# Patient Record
Sex: Female | Born: 1935 | Race: White | Hispanic: No | Smoking: Never smoker
Health system: Southern US, Community
[De-identification: ages and names within clinical notes are randomized; demographics above are authoritative.]

## PROBLEM LIST (undated history)

## (undated) DIAGNOSIS — T8859XA Other complications of anesthesia, initial encounter: Secondary | ICD-10-CM

## (undated) DIAGNOSIS — B029 Zoster without complications: Secondary | ICD-10-CM

## (undated) DIAGNOSIS — Z9221 Personal history of antineoplastic chemotherapy: Secondary | ICD-10-CM

## (undated) DIAGNOSIS — K5900 Constipation, unspecified: Secondary | ICD-10-CM

## (undated) DIAGNOSIS — C801 Malignant (primary) neoplasm, unspecified: Secondary | ICD-10-CM

## (undated) DIAGNOSIS — T884XXA Failed or difficult intubation, initial encounter: Secondary | ICD-10-CM

## (undated) DIAGNOSIS — Z9289 Personal history of other medical treatment: Secondary | ICD-10-CM

## (undated) DIAGNOSIS — Z8781 Personal history of (healed) traumatic fracture: Secondary | ICD-10-CM

## (undated) DIAGNOSIS — J189 Pneumonia, unspecified organism: Secondary | ICD-10-CM

## (undated) DIAGNOSIS — Z8619 Personal history of other infectious and parasitic diseases: Secondary | ICD-10-CM

## (undated) DIAGNOSIS — M1990 Unspecified osteoarthritis, unspecified site: Secondary | ICD-10-CM

## (undated) DIAGNOSIS — C50919 Malignant neoplasm of unspecified site of unspecified female breast: Secondary | ICD-10-CM

## (undated) DIAGNOSIS — A159 Respiratory tuberculosis unspecified: Secondary | ICD-10-CM

## (undated) DIAGNOSIS — T4145XA Adverse effect of unspecified anesthetic, initial encounter: Secondary | ICD-10-CM

## (undated) DIAGNOSIS — D649 Anemia, unspecified: Secondary | ICD-10-CM

## (undated) DIAGNOSIS — I639 Cerebral infarction, unspecified: Secondary | ICD-10-CM

## (undated) DIAGNOSIS — Z87440 Personal history of urinary (tract) infections: Secondary | ICD-10-CM

## (undated) HISTORY — PX: APPENDECTOMY: SHX54

## (undated) HISTORY — PX: BREAST SURGERY: SHX581

## (undated) HISTORY — PX: FRACTURE SURGERY: SHX138

## (undated) HISTORY — PX: HEMORRHOID SURGERY: SHX153

## (undated) HISTORY — PX: HEMORROIDECTOMY: SUR656

## (undated) HISTORY — PX: EYE SURGERY: SHX253

## (undated) HISTORY — PX: TONSILLECTOMY: SUR1361

## (undated) HISTORY — DX: Malignant neoplasm of unspecified site of unspecified female breast: C50.919

## (undated) HISTORY — PX: SHOULDER SURGERY: SHX246

## (undated) HISTORY — PX: ABDOMINAL HYSTERECTOMY: SHX81

## (undated) HISTORY — DX: Malignant (primary) neoplasm, unspecified: C80.1

## (undated) HISTORY — PX: CATARACT EXTRACTION: SUR2

## (undated) HISTORY — PX: OOPHORECTOMY: SHX86

## (undated) HISTORY — DX: Personal history of other infectious and parasitic diseases: Z86.19

## (undated) HISTORY — DX: Unspecified osteoarthritis, unspecified site: M19.90

## (undated) HISTORY — PX: OTHER SURGICAL HISTORY: SHX169

## (undated) HISTORY — PX: COLONOSCOPY: SHX174

---

## 1998-02-27 ENCOUNTER — Other Ambulatory Visit: Admission: RE | Admit: 1998-02-27 | Discharge: 1998-02-27 | Payer: Self-pay | Admitting: Obstetrics and Gynecology

## 1998-09-25 ENCOUNTER — Other Ambulatory Visit: Admission: RE | Admit: 1998-09-25 | Discharge: 1998-09-25 | Payer: Self-pay | Admitting: Obstetrics and Gynecology

## 1998-12-02 ENCOUNTER — Encounter (HOSPITAL_COMMUNITY): Payer: Self-pay | Admitting: Oncology

## 1998-12-02 ENCOUNTER — Ambulatory Visit (HOSPITAL_COMMUNITY): Admission: RE | Admit: 1998-12-02 | Discharge: 1998-12-02 | Payer: Self-pay | Admitting: Oncology

## 1999-04-14 ENCOUNTER — Other Ambulatory Visit: Admission: RE | Admit: 1999-04-14 | Discharge: 1999-04-14 | Payer: Self-pay | Admitting: Obstetrics and Gynecology

## 1999-10-26 ENCOUNTER — Other Ambulatory Visit: Admission: RE | Admit: 1999-10-26 | Discharge: 1999-10-26 | Payer: Self-pay | Admitting: Obstetrics and Gynecology

## 1999-12-09 ENCOUNTER — Ambulatory Visit (HOSPITAL_COMMUNITY): Admission: RE | Admit: 1999-12-09 | Discharge: 1999-12-09 | Payer: Self-pay | Admitting: Oncology

## 1999-12-09 ENCOUNTER — Encounter (HOSPITAL_COMMUNITY): Payer: Self-pay | Admitting: Oncology

## 1999-12-16 ENCOUNTER — Ambulatory Visit (HOSPITAL_COMMUNITY): Admission: RE | Admit: 1999-12-16 | Discharge: 1999-12-16 | Payer: Self-pay | Admitting: Oncology

## 1999-12-16 ENCOUNTER — Encounter (INDEPENDENT_AMBULATORY_CARE_PROVIDER_SITE_OTHER): Payer: Self-pay

## 1999-12-16 ENCOUNTER — Encounter (HOSPITAL_COMMUNITY): Payer: Self-pay | Admitting: Oncology

## 2000-09-01 ENCOUNTER — Encounter: Admission: RE | Admit: 2000-09-01 | Discharge: 2000-09-01 | Payer: Self-pay | Admitting: Oncology

## 2000-09-01 ENCOUNTER — Encounter (HOSPITAL_COMMUNITY): Payer: Self-pay | Admitting: Oncology

## 2000-10-17 ENCOUNTER — Other Ambulatory Visit: Admission: RE | Admit: 2000-10-17 | Discharge: 2000-10-17 | Payer: Self-pay | Admitting: Obstetrics and Gynecology

## 2000-12-13 ENCOUNTER — Encounter (HOSPITAL_COMMUNITY): Payer: Self-pay | Admitting: Oncology

## 2000-12-13 ENCOUNTER — Encounter: Admission: RE | Admit: 2000-12-13 | Discharge: 2000-12-13 | Payer: Self-pay | Admitting: Oncology

## 2001-01-23 ENCOUNTER — Ambulatory Visit (HOSPITAL_COMMUNITY): Admission: RE | Admit: 2001-01-23 | Discharge: 2001-01-23 | Payer: Self-pay | Admitting: Internal Medicine

## 2001-01-23 ENCOUNTER — Encounter: Payer: Self-pay | Admitting: Internal Medicine

## 2001-02-28 ENCOUNTER — Encounter: Admission: RE | Admit: 2001-02-28 | Discharge: 2001-02-28 | Payer: Self-pay | Admitting: Oncology

## 2001-02-28 ENCOUNTER — Encounter (HOSPITAL_COMMUNITY): Payer: Self-pay | Admitting: Oncology

## 2001-04-24 ENCOUNTER — Other Ambulatory Visit: Admission: RE | Admit: 2001-04-24 | Discharge: 2001-04-24 | Payer: Self-pay | Admitting: Obstetrics and Gynecology

## 2001-11-28 ENCOUNTER — Encounter: Admission: RE | Admit: 2001-11-28 | Discharge: 2001-11-28 | Payer: Self-pay | Admitting: Oncology

## 2001-11-28 ENCOUNTER — Encounter (HOSPITAL_COMMUNITY): Admission: RE | Admit: 2001-11-28 | Discharge: 2001-12-28 | Payer: Self-pay | Admitting: Oncology

## 2001-12-10 ENCOUNTER — Ambulatory Visit (HOSPITAL_COMMUNITY): Admission: RE | Admit: 2001-12-10 | Discharge: 2001-12-10 | Payer: Self-pay | Admitting: Gastroenterology

## 2002-02-20 ENCOUNTER — Encounter (HOSPITAL_COMMUNITY): Admission: RE | Admit: 2002-02-20 | Discharge: 2002-03-22 | Payer: Self-pay | Admitting: Oncology

## 2002-02-20 ENCOUNTER — Encounter: Admission: RE | Admit: 2002-02-20 | Discharge: 2002-02-20 | Payer: Self-pay | Admitting: Oncology

## 2002-03-13 ENCOUNTER — Other Ambulatory Visit: Admission: RE | Admit: 2002-03-13 | Discharge: 2002-03-13 | Payer: Self-pay | Admitting: Obstetrics and Gynecology

## 2002-04-25 ENCOUNTER — Inpatient Hospital Stay (HOSPITAL_COMMUNITY): Admission: RE | Admit: 2002-04-25 | Discharge: 2002-04-27 | Payer: Self-pay | Admitting: Orthopaedic Surgery

## 2002-04-25 ENCOUNTER — Encounter: Payer: Self-pay | Admitting: Orthopaedic Surgery

## 2002-05-03 ENCOUNTER — Emergency Department (HOSPITAL_COMMUNITY): Admission: EM | Admit: 2002-05-03 | Discharge: 2002-05-03 | Payer: Self-pay | Admitting: Emergency Medicine

## 2002-11-26 ENCOUNTER — Encounter: Admission: RE | Admit: 2002-11-26 | Discharge: 2002-11-26 | Payer: Self-pay | Admitting: Oncology

## 2002-11-26 ENCOUNTER — Encounter (HOSPITAL_COMMUNITY): Admission: RE | Admit: 2002-11-26 | Discharge: 2002-12-26 | Payer: Self-pay | Admitting: Oncology

## 2002-12-22 ENCOUNTER — Encounter: Payer: Self-pay | Admitting: Emergency Medicine

## 2002-12-22 ENCOUNTER — Emergency Department (HOSPITAL_COMMUNITY): Admission: EM | Admit: 2002-12-22 | Discharge: 2002-12-22 | Payer: Self-pay | Admitting: Emergency Medicine

## 2002-12-27 ENCOUNTER — Encounter (HOSPITAL_COMMUNITY): Payer: Self-pay | Admitting: Oncology

## 2002-12-27 ENCOUNTER — Encounter: Admission: RE | Admit: 2002-12-27 | Discharge: 2002-12-27 | Payer: Self-pay | Admitting: Oncology

## 2003-03-19 ENCOUNTER — Other Ambulatory Visit: Admission: RE | Admit: 2003-03-19 | Discharge: 2003-03-19 | Payer: Self-pay | Admitting: Obstetrics and Gynecology

## 2003-04-17 ENCOUNTER — Encounter (HOSPITAL_COMMUNITY): Payer: Self-pay | Admitting: Oncology

## 2003-04-17 ENCOUNTER — Encounter: Admission: RE | Admit: 2003-04-17 | Discharge: 2003-04-17 | Payer: Self-pay | Admitting: Oncology

## 2003-12-08 ENCOUNTER — Encounter: Admission: RE | Admit: 2003-12-08 | Discharge: 2003-12-08 | Payer: Self-pay | Admitting: Oncology

## 2003-12-08 ENCOUNTER — Encounter (HOSPITAL_COMMUNITY): Admission: RE | Admit: 2003-12-08 | Discharge: 2004-01-07 | Payer: Self-pay | Admitting: Oncology

## 2004-03-22 ENCOUNTER — Other Ambulatory Visit: Admission: RE | Admit: 2004-03-22 | Discharge: 2004-03-22 | Payer: Self-pay | Admitting: Obstetrics and Gynecology

## 2004-07-28 ENCOUNTER — Encounter: Admission: RE | Admit: 2004-07-28 | Discharge: 2004-07-28 | Payer: Self-pay | Admitting: Obstetrics and Gynecology

## 2004-12-07 ENCOUNTER — Ambulatory Visit (HOSPITAL_COMMUNITY): Payer: Self-pay | Admitting: Oncology

## 2004-12-07 ENCOUNTER — Encounter (HOSPITAL_COMMUNITY): Admission: RE | Admit: 2004-12-07 | Discharge: 2005-01-06 | Payer: Self-pay | Admitting: Oncology

## 2004-12-07 ENCOUNTER — Encounter: Admission: RE | Admit: 2004-12-07 | Discharge: 2004-12-07 | Payer: Self-pay | Admitting: Oncology

## 2005-03-24 ENCOUNTER — Other Ambulatory Visit: Admission: RE | Admit: 2005-03-24 | Discharge: 2005-03-24 | Payer: Self-pay | Admitting: Obstetrics and Gynecology

## 2005-09-13 ENCOUNTER — Encounter: Admission: RE | Admit: 2005-09-13 | Discharge: 2005-09-13 | Payer: Self-pay | Admitting: Obstetrics and Gynecology

## 2005-12-09 ENCOUNTER — Encounter (HOSPITAL_COMMUNITY): Admission: RE | Admit: 2005-12-09 | Discharge: 2006-01-08 | Payer: Self-pay | Admitting: Oncology

## 2005-12-09 ENCOUNTER — Ambulatory Visit (HOSPITAL_COMMUNITY): Payer: Self-pay | Admitting: Oncology

## 2005-12-09 ENCOUNTER — Encounter: Admission: RE | Admit: 2005-12-09 | Discharge: 2005-12-09 | Payer: Self-pay | Admitting: Oncology

## 2006-04-20 ENCOUNTER — Other Ambulatory Visit: Admission: RE | Admit: 2006-04-20 | Discharge: 2006-04-20 | Payer: Self-pay | Admitting: Obstetrics and Gynecology

## 2006-12-19 ENCOUNTER — Ambulatory Visit (HOSPITAL_COMMUNITY): Payer: Self-pay | Admitting: Oncology

## 2006-12-19 ENCOUNTER — Encounter (HOSPITAL_COMMUNITY): Admission: RE | Admit: 2006-12-19 | Discharge: 2007-01-18 | Payer: Self-pay | Admitting: Oncology

## 2007-05-02 ENCOUNTER — Other Ambulatory Visit: Admission: RE | Admit: 2007-05-02 | Discharge: 2007-05-02 | Payer: Self-pay | Admitting: Obstetrics and Gynecology

## 2008-01-11 ENCOUNTER — Encounter (HOSPITAL_COMMUNITY): Admission: RE | Admit: 2008-01-11 | Discharge: 2008-02-10 | Payer: Self-pay | Admitting: Oncology

## 2008-01-11 ENCOUNTER — Ambulatory Visit (HOSPITAL_COMMUNITY): Payer: Self-pay | Admitting: Oncology

## 2009-01-02 ENCOUNTER — Encounter (HOSPITAL_COMMUNITY): Admission: RE | Admit: 2009-01-02 | Discharge: 2009-02-01 | Payer: Self-pay | Admitting: Oncology

## 2009-01-02 ENCOUNTER — Ambulatory Visit (HOSPITAL_COMMUNITY): Payer: Self-pay | Admitting: Oncology

## 2010-02-19 ENCOUNTER — Encounter (HOSPITAL_COMMUNITY): Admission: RE | Admit: 2010-02-19 | Discharge: 2010-03-21 | Payer: Self-pay | Admitting: Oncology

## 2010-02-19 ENCOUNTER — Ambulatory Visit (HOSPITAL_COMMUNITY): Payer: Self-pay | Admitting: Oncology

## 2010-06-22 ENCOUNTER — Emergency Department (HOSPITAL_COMMUNITY): Admission: EM | Admit: 2010-06-22 | Discharge: 2010-06-22 | Payer: Self-pay | Admitting: Family Medicine

## 2010-06-26 ENCOUNTER — Emergency Department (HOSPITAL_COMMUNITY): Admission: EM | Admit: 2010-06-26 | Discharge: 2010-06-26 | Payer: Self-pay | Admitting: Emergency Medicine

## 2010-07-31 ENCOUNTER — Emergency Department (HOSPITAL_COMMUNITY): Admission: EM | Admit: 2010-07-31 | Discharge: 2010-07-31 | Payer: Self-pay | Admitting: Emergency Medicine

## 2010-12-11 ENCOUNTER — Encounter (HOSPITAL_COMMUNITY): Payer: Self-pay | Admitting: Oncology

## 2010-12-11 ENCOUNTER — Other Ambulatory Visit: Payer: Self-pay | Admitting: Internal Medicine

## 2010-12-11 DIAGNOSIS — Z0000 Encounter for general adult medical examination without abnormal findings: Secondary | ICD-10-CM

## 2010-12-22 ENCOUNTER — Ambulatory Visit: Payer: Self-pay

## 2011-01-06 ENCOUNTER — Other Ambulatory Visit (HOSPITAL_COMMUNITY)
Admission: RE | Admit: 2011-01-06 | Discharge: 2011-01-06 | Disposition: A | Discharge status: Home / Self Care | Payer: Medicare Other | Source: Ambulatory Visit | Attending: Obstetrics and Gynecology | Admitting: Obstetrics and Gynecology

## 2011-01-06 ENCOUNTER — Other Ambulatory Visit: Payer: Self-pay | Admitting: Obstetrics and Gynecology

## 2011-01-06 ENCOUNTER — Ambulatory Visit (INDEPENDENT_AMBULATORY_CARE_PROVIDER_SITE_OTHER): Payer: Medicare Other | Admitting: Obstetrics and Gynecology

## 2011-01-06 DIAGNOSIS — Z124 Encounter for screening for malignant neoplasm of cervix: Secondary | ICD-10-CM

## 2011-01-06 DIAGNOSIS — N3941 Urge incontinence: Secondary | ICD-10-CM

## 2011-01-06 DIAGNOSIS — C549 Malignant neoplasm of corpus uteri, unspecified: Secondary | ICD-10-CM

## 2011-02-03 LAB — COMPREHENSIVE METABOLIC PANEL
AST: 24 U/L (ref 0–37)
BUN: 13 mg/dL (ref 6–23)
CO2: 26 mEq/L (ref 19–32)
Calcium: 8.9 mg/dL (ref 8.4–10.5)
Chloride: 107 mEq/L (ref 96–112)
Creatinine, Ser: 0.58 mg/dL (ref 0.4–1.2)
GFR calc Af Amer: >60 mL/min (ref >60)
GFR calc non Af Amer: >60 mL/min (ref >60)
Total Bilirubin: 0.5 mg/dL (ref 0.3–1.2)

## 2011-02-03 LAB — CBC
Hemoglobin: 13.4 g/dL (ref 12.0–15.0)
MCH: 31.2 pg (ref 26.0–34.0)
MCHC: 33.8 g/dL (ref 30.0–36.0)
MCV: 92.3 fL (ref 78.0–100.0)
RBC: 4.3 MIL/uL (ref 3.87–5.11)

## 2011-02-03 LAB — URINE CULTURE
Culture  Setup Time: 201109101718
Culture: NO GROWTH

## 2011-02-03 LAB — DIFFERENTIAL
Basophils Absolute: 0.1 10*3/uL (ref 0.0–0.1)
Eosinophils Relative: 4 % (ref 0–5)
Lymphocytes Relative: 28 % (ref 12–46)
Lymphs Abs: 1.3 10*3/uL (ref 0.7–4.0)
Neutrophils Relative %: 59 % (ref 43–77)

## 2011-02-03 LAB — URINALYSIS, ROUTINE W REFLEX MICROSCOPIC
Bilirubin Urine: NEGATIVE
Hgb urine dipstick: NEGATIVE
Nitrite: NEGATIVE
Specific Gravity, Urine: 1.006 (ref 1.005–1.030)
pH: 7 (ref 5.0–8.0)

## 2011-02-04 LAB — CBC
HCT: 39.3 % (ref 36.0–46.0)
Hemoglobin: 13.5 g/dL (ref 12.0–15.0)
MCH: 31.6 pg (ref 26.0–34.0)
MCHC: 34.2 g/dL (ref 30.0–36.0)
MCV: 92.4 fL (ref 78.0–100.0)
RBC: 4.26 MIL/uL (ref 3.87–5.11)

## 2011-02-04 LAB — URINALYSIS, ROUTINE W REFLEX MICROSCOPIC
Bilirubin Urine: NEGATIVE
Glucose, UA: NEGATIVE mg/dL
Hgb urine dipstick: NEGATIVE
Protein, ur: NEGATIVE mg/dL
Specific Gravity, Urine: 1.006 (ref 1.005–1.030)
Urobilinogen, UA: 0.2 mg/dL (ref 0.0–1.0)

## 2011-02-04 LAB — DIFFERENTIAL
Basophils Relative: 2 % — ABNORMAL HIGH (ref 0–1)
Eosinophils Absolute: 0.2 10*3/uL (ref 0.0–0.7)
Eosinophils Relative: 4 % (ref 0–5)
Lymphs Abs: 1.5 10*3/uL (ref 0.7–4.0)
Monocytes Absolute: 0.4 10*3/uL (ref 0.1–1.0)
Monocytes Relative: 10 % (ref 3–12)

## 2011-02-04 LAB — BASIC METABOLIC PANEL
CO2: 24 mEq/L (ref 19–32)
Chloride: 105 mEq/L (ref 96–112)
GFR calc Af Amer: >60 mL/min (ref >60)
Glucose, Bld: 93 mg/dL (ref 70–99)
Sodium: 137 mEq/L (ref 135–145)

## 2011-02-04 LAB — URINE CULTURE
Colony Count: NO GROWTH
Culture: NO GROWTH

## 2011-02-04 LAB — POCT URINALYSIS DIPSTICK
Bilirubin Urine: NEGATIVE
Nitrite: NEGATIVE
Protein, ur: NEGATIVE mg/dL
Urobilinogen, UA: 0.2 mg/dL (ref 0.0–1.0)
pH: 5 (ref 5.0–8.0)

## 2011-02-09 LAB — COMPREHENSIVE METABOLIC PANEL
ALT: 20 U/L (ref 0–35)
Albumin: 4.1 g/dL (ref 3.5–5.2)
Calcium: 9.2 mg/dL (ref 8.4–10.5)
Glucose, Bld: 105 mg/dL — ABNORMAL HIGH (ref 70–99)
Sodium: 140 mEq/L (ref 135–145)
Total Protein: 7.1 g/dL (ref 6.0–8.3)

## 2011-02-09 LAB — CBC
Hemoglobin: 14.1 g/dL (ref 12.0–15.0)
MCHC: 35 g/dL (ref 30.0–36.0)
Platelets: 195 10*3/uL (ref 150–400)
RDW: 13.8 % (ref 11.5–15.5)

## 2011-02-09 LAB — DIFFERENTIAL
Eosinophils Absolute: 0.2 10*3/uL (ref 0.0–0.7)
Lymphs Abs: 1.9 10*3/uL (ref 0.7–4.0)
Monocytes Absolute: 0.4 10*3/uL (ref 0.1–1.0)
Monocytes Relative: 8 % (ref 3–12)
Neutro Abs: 2.5 10*3/uL (ref 1.7–7.7)
Neutrophils Relative %: 51 % (ref 43–77)

## 2011-02-16 ENCOUNTER — Ambulatory Visit (HOSPITAL_COMMUNITY): Payer: Medicare Other | Admitting: Oncology

## 2011-03-08 LAB — CBC
Hemoglobin: 14.1 g/dL (ref 12.0–15.0)
MCHC: 34.3 g/dL (ref 30.0–36.0)
MCV: 92.1 fL (ref 78.0–100.0)
RBC: 4.45 MIL/uL (ref 3.87–5.11)

## 2011-03-08 LAB — COMPREHENSIVE METABOLIC PANEL
CO2: 26 mEq/L (ref 19–32)
Calcium: 9.2 mg/dL (ref 8.4–10.5)
Creatinine, Ser: 0.6 mg/dL (ref 0.4–1.2)
GFR calc non Af Amer: >60 mL/min (ref >60)
Glucose, Bld: 100 mg/dL — ABNORMAL HIGH (ref 70–99)

## 2011-03-08 LAB — DIFFERENTIAL
Eosinophils Absolute: 0.2 10*3/uL (ref 0.0–0.7)
Lymphocytes Relative: 31 % (ref 12–46)
Lymphs Abs: 1.8 10*3/uL (ref 0.7–4.0)
Neutro Abs: 3.5 10*3/uL (ref 1.7–7.7)
Neutrophils Relative %: 59 % (ref 43–77)

## 2011-03-09 ENCOUNTER — Encounter (HOSPITAL_COMMUNITY): Payer: Medicare Other | Attending: Oncology | Admitting: Oncology

## 2011-03-09 DIAGNOSIS — C50919 Malignant neoplasm of unspecified site of unspecified female breast: Secondary | ICD-10-CM

## 2011-04-08 NOTE — Op Note (Signed)
[facility]  Patient:    [name], [name] Visit Number: [id number] MRN: [id number]          Service Type: DSU Location: [phone] Attending Physician:  Marcene Corning Dictated by:   Lubertha Basque. Jerl Santos, M.D. Proc. Date: 04/26/02 Admit Date:  04/25/2002 Discharge Date: 04/27/2002                             Operative Report  PREOPERATIVE DIAGNOSIS:  Left shoulder four-part proximal humerus fracture.  POSTOPERATIVE DIAGNOSIS: 1. Left shoulder four-part proximal humerus fracture. 2. Left shoulder glenoid fracture. 3. Left shoulder coracoid fracture.  OPERATION PERFORMED: 1. Left shoulder hemiarthroplasty. 2. Left shoulder glenoid open reduction internal fixation. 3. Left shoulder coracoid transfer.  ANESTHESIA:  General and block.  ATTENDING SURGEON:  Lubertha Basque. Jerl Santos, M.D.  ASSISTANT:  Lindwood Qua, P.A.  INDICATIONS FOR PROCEDURE:  The patient is a 77 year old woman who fell two days ago at [facility].  She sustained a four-part proximal humerus fracture with the humeral head being dislocated.  It was recommended that she have a hemiarthroplasty performed.  Informed operative consent was obtained after discussion of possible complications of reaction to anesthesia, infection, pulmonary embolus and death.  She also understood about the probability of significant shoulder stiffness after this type of injury.  DESCRIPTION OF PROCEDURE:  The patient was taken to an operating suite where general anesthetic was applied without difficulty.  She was also given an interscalene block in the preanesthesia area.  She was then positioned in the beach chair position and prepped and draped in normal sterile fashion.  After the administration of preop intravenous antibiotics, extended deltopectoral approach was taken to the left shoulder.  The deltopectoral interval was found.  The cephalic vein was taken with the deltoid laterally.  At this  point a coracoid fracture was encountered with an avulsion of the pectoralis minor and conjoined tendon.  This was tagged for later treatment.  On further dissection, the lesser tuberosity fragment with the attached subscapularis was isolated and tagged.  The humeral head was removed and a portion of rotator cuff was salvaged with an attached piece of bone and this too was tagged.  It was found that the glenoid had a major fracture with a completely displaced inferior third.  This had all the labral tissues attached.  This was reduced to the upper two thirds and secured with two screws from the Depuy Ace small fragment set.  Used two of the cancellous cannulated screws and we achieved good fixation.  Once this was completed, the humeral shaft was isolated.  It was felt that the size 10 prosthesis would work best and this was reamed up to size 10.  A cement plug was placed distally followed by mixing of cement with antibiotic.  This was then pressurized into the humeral shaft followed by placement of a #10 global FX stem in about 30 degrees of retroversion.  This was left about 1 cm proud as during the trial reduction, this was found to be the best position.  The prosthesis well held until cement had hardened and excess was trimmed.  The 44 x 15 head was found to be best during the trial reduction and this was eventually placed with the [facility] taper.  The construct was then reduced into the glenoid.  The greater tuberosity and lesser tuberosity bone fragments were sewn to the fins of the prosthesis with nonabsorbable  Ethibond suture.  She was also found to have a laceration of her biceps tendon and a tenodesis of this structure was performed just distal to the prosthesis.  The residual biceps was excised to the level of the glenoid. At this point the coracoid fragment with the conjoined tendon and pectoralis minor attached was sewn to the humeral shaft proximally.  This could not  be reattached to the coracoid without undue tension.  The shoulder appeared to move fairly well and stay reduced.  The wound was thoroughly irrigated followed by reapproximation of the deltopectoral interval with 0 Vicryl and subcutaneous tissues with this suture and 2-0 undyed Vicryl.  Skin was closed with staples.  Adaptic was applied to the wound followed by dry gauze and tape.  Estimated blood loss and intraoperative fluids can be obtained from Anesthesia records.  DISPOSITION:  The patient was extubated in the operating room and taken to the recovery room in stable condition.  Plans were for her to be admitted back to the orthopedic surgery for appropriate postoperative care to include IV antibiotics for a couple of days. DISPOSITION:  The patient was extubated in the operating room and taken to the recovery room in stable condition.  Plans were for  to go home the same day and to follow up in the office in less than a week.  I will contact  by phone tonight. Dictated by:   Lubertha Basque Jerl Santos, M.D. Attending Physician:  Marcene Corning DD:  04/26/02 TD:  04/29/02 Job: [id number]

## 2011-04-08 NOTE — Procedures (Signed)
[facility]  Patient:    [name], [name] Visit Number: [id number] MRN: [id number]          Service Type: END Location: ENDO Attending Physician:  Rich Brave Dictated by:   Florencia Reasons, M.D. Proc. Date: 12/10/01 Admit Date:  12/10/2001   CC:         [name], M.D.  Daniel L. Eda Paschal, M.D.   Procedure Report  PROCEDURE PERFORMED:  Colonoscopy.  ENDOSCOPIST:  Florencia Reasons, M.D.  INDICATIONS FOR PROCEDURE:  The patient is a 77 year old female for colon cancer screening.  There is a family history of colon cancer in her daughter, diagnosed around age 41.  That patient, [name], is a patient of mine.  FINDINGS:  Normal exam to the cecum.  DESCRIPTION OF PROCEDURE:  The nature, purpose and risks of the procedure had been discussed with the patient, who provided written consent.  Sedation was fentanyl 75 mcg and Versed 6 mg IV without arrhythmias or desaturation.  The Olympus adult video colonoscope was advanced with slight difficulty due to looping and redundancy of the colon.  I was able to reach the cecum without undue difficulty, turned the patient into the supine position and applying some external abdominal compression.  Pull-back was then performed.  The quality of the prep was very good and it is felt that all areas were well seen.  This was a normal examination.  No polyps, cancer, colitis, vascular malformations or diverticular disease were noted.  No biopsies were obtained.  Retroflexion was attempted in the rectum, but could not be accomplished.  The patient tolerated the procedure well and there were no apparent complications.  IMPRESSION:  Normal colonoscopy.  PLAN:  In view of the patients advanced age, and the fact that she has not formed any adenomas by this point in life, I think a good case could be made for no further screening examinations.  For that reason, I will not put  her on the follow-up  list, but certainly if worrisome findings, such as unexplained anemia or hemoccult positive stool were noted in the future, fluoroscopic evaluation in that circumstance might well be considered.Dictated by:   Molly Maduro . Buccini, M.D. Attending Physician:  Rich Brave DD:  12/10/01 TD:  12/11/01 Job: [id number]

## 2011-08-12 LAB — COMPREHENSIVE METABOLIC PANEL
AST: 23
CO2: 26
Chloride: 106
Creatinine, Ser: 0.56
GFR calc Af Amer: >60
GFR calc non Af Amer: >60
Total Bilirubin: 0.7

## 2011-08-12 LAB — CBC
HCT: 40.2
MCV: 91.8
RBC: 4.38
WBC: 5.4

## 2011-08-12 LAB — DIFFERENTIAL
Basophils Absolute: 0
Basophils Relative: 1
Eosinophils Absolute: 0.1
Eosinophils Relative: 3
Lymphocytes Relative: 38

## 2012-01-25 ENCOUNTER — Encounter: Payer: Self-pay | Admitting: Gynecology

## 2012-01-25 DIAGNOSIS — C55 Malignant neoplasm of uterus, part unspecified: Secondary | ICD-10-CM | POA: Insufficient documentation

## 2012-01-25 DIAGNOSIS — C50911 Malignant neoplasm of unspecified site of right female breast: Secondary | ICD-10-CM | POA: Insufficient documentation

## 2012-01-25 DIAGNOSIS — M1990 Unspecified osteoarthritis, unspecified site: Secondary | ICD-10-CM | POA: Insufficient documentation

## 2012-01-25 DIAGNOSIS — C801 Malignant (primary) neoplasm, unspecified: Secondary | ICD-10-CM | POA: Insufficient documentation

## 2012-01-31 ENCOUNTER — Other Ambulatory Visit (HOSPITAL_COMMUNITY)
Admission: RE | Admit: 2012-01-31 | Discharge: 2012-01-31 | Disposition: A | Discharge status: Home / Self Care | Payer: Medicare Other | Source: Ambulatory Visit | Attending: Obstetrics and Gynecology | Admitting: Obstetrics and Gynecology

## 2012-01-31 ENCOUNTER — Ambulatory Visit (INDEPENDENT_AMBULATORY_CARE_PROVIDER_SITE_OTHER): Payer: Medicare Other | Admitting: Obstetrics and Gynecology

## 2012-01-31 ENCOUNTER — Encounter: Payer: Self-pay | Admitting: Obstetrics and Gynecology

## 2012-01-31 VITALS — BP 136/84 | Ht 61.0 in | Wt 124.0 lb

## 2012-01-31 DIAGNOSIS — Z124 Encounter for screening for malignant neoplasm of cervix: Secondary | ICD-10-CM | POA: Insufficient documentation

## 2012-01-31 DIAGNOSIS — C55 Malignant neoplasm of uterus, part unspecified: Secondary | ICD-10-CM

## 2012-01-31 NOTE — Progress Notes (Signed)
Patient came to see me today for further follow up of her uterine cancer. She is status post TAH/BSO. She is having no vaginal bleeding. She is having no pelvic pain. She has also had breast cancer and gets followup for that with Dr. Mariel Sleet. He does her breast exams and her mammograms which have been normal. She has normal bone densities through his office. She does her lab work through PCP.  ROS: Pertinent positives about. Other positive is osteoarthritis.  Exam: Abdomen-soft without masses or guarding. Ext: wnl. BUS: wnl. Vaginal exam: wnl. Cervix and uterus surgically absent. Adnexa: no masses. Rectovaginal exam: negative. Pap done. Kennon Portela present for exam.  Assessment: #1. Uterine cancer #2. Breast cancer  Plan: Continue yearly mammograms.

## 2012-02-06 ENCOUNTER — Encounter: Payer: Self-pay | Admitting: Obstetrics and Gynecology

## 2012-02-16 DIAGNOSIS — Z8619 Personal history of other infectious and parasitic diseases: Secondary | ICD-10-CM

## 2012-02-16 HISTORY — DX: Personal history of other infectious and parasitic diseases: Z86.19

## 2012-02-20 ENCOUNTER — Encounter: Payer: Self-pay | Admitting: Oncology

## 2012-03-07 ENCOUNTER — Encounter (HOSPITAL_COMMUNITY): Payer: Medicare Other | Attending: Oncology | Admitting: Oncology

## 2012-03-07 ENCOUNTER — Encounter (HOSPITAL_COMMUNITY): Payer: Self-pay | Admitting: Oncology

## 2012-03-07 VITALS — BP 177/89 | HR 80 | Temp 98.3°F | Ht 59.0 in | Wt 124.2 lb

## 2012-03-07 DIAGNOSIS — B029 Zoster without complications: Secondary | ICD-10-CM | POA: Insufficient documentation

## 2012-03-07 DIAGNOSIS — C50919 Malignant neoplasm of unspecified site of unspecified female breast: Secondary | ICD-10-CM

## 2012-03-07 DIAGNOSIS — Z8543 Personal history of malignant neoplasm of ovary: Secondary | ICD-10-CM

## 2012-03-07 DIAGNOSIS — Z853 Personal history of malignant neoplasm of breast: Secondary | ICD-10-CM | POA: Insufficient documentation

## 2012-03-07 DIAGNOSIS — Z09 Encounter for follow-up examination after completed treatment for conditions other than malignant neoplasm: Secondary | ICD-10-CM | POA: Insufficient documentation

## 2012-03-07 DIAGNOSIS — M810 Age-related osteoporosis without current pathological fracture: Secondary | ICD-10-CM

## 2012-03-07 DIAGNOSIS — Z8542 Personal history of malignant neoplasm of other parts of uterus: Secondary | ICD-10-CM | POA: Insufficient documentation

## 2012-03-07 LAB — COMPREHENSIVE METABOLIC PANEL
ALT: 18 U/L (ref 0–35)
AST: 21 U/L (ref 0–37)
Albumin: 4 g/dL (ref 3.5–5.2)
CO2: 29 mEq/L (ref 19–32)
Chloride: 102 mEq/L (ref 96–112)
GFR calc non Af Amer: 80 mL/min — ABNORMAL LOW (ref >90)
Potassium: 3.7 mEq/L (ref 3.5–5.1)
Sodium: 140 mEq/L (ref 135–145)
Total Bilirubin: 0.4 mg/dL (ref 0.3–1.2)

## 2012-03-07 LAB — DIFFERENTIAL
Basophils Absolute: 0 10*3/uL (ref 0.0–0.1)
Lymphocytes Relative: 40 % (ref 12–46)
Neutro Abs: 2.7 10*3/uL (ref 1.7–7.7)
Neutrophils Relative %: 50 % (ref 43–77)

## 2012-03-07 LAB — CBC
HCT: 39.6 % (ref 36.0–46.0)
Platelets: 232 10*3/uL (ref 150–400)
RDW: 14.6 % (ref 11.5–15.5)
WBC: 5.5 10*3/uL (ref 4.0–10.5)

## 2012-03-07 NOTE — Patient Instructions (Signed)
[name]  [id number] November 11, 1920 Dr. Glenford Peers   [facility]  Discharge Instructions  RECOMMENDATIONS MADE BY THE CONSULTANT AND ANY TEST RESULTS WILL BE SENT TO YOUR REFERRING DOCTOR.   EXAM FINDINGS BY MD TODAY AND SIGNS AND SYMPTOMS TO REPORT TO CLINIC OR PRIMARY MD: You are doing well.  We will check your blood work today.  MEDICATIONS PRESCRIBED: none   INSTRUCTIONS GIVEN AND DISCUSSED: Other :  Report any new lumps, bone pain or shortness of breath.  SPECIAL INSTRUCTIONS/FOLLOW-UP: Lab work Needed today and Return to Clinic in 1 year.   I acknowledge that I have been informed and understand all the instructions given to me and received a copy. I do not have any more questions at this time, but understand that I may call the [facility]ty [facility] at [facility] at [phone] during business hours should I have any further questions or need assistance in obtaining follow-up care.    __________________________________________  _____________  __________ Signature of Patient or Authorized Representative            Date                   Time    __________________________________________ Nurse's Signature

## 2012-03-07 NOTE — Progress Notes (Signed)
[name] presented for labwork. Labs per MD order drawn via Peripheral Line 23 gauge needle inserted in left AC  Good blood return present. Procedure without incident.  Needle removed intact. Patient tolerated procedure well.

## 2012-03-07 NOTE — Progress Notes (Signed)
CC:   Geoffry Paradise, M.D.  DIAGNOSES: 1. Bilateral breast cancers, both stage I and grade 1, the right     diagnosed in June 1995 and the left in 1996, both treated with     lumpectomy followed by radiation therapy in [place].  She took tamoxifen for several years, stopped it due to     the development of uterine cancer in 1998. 2. Tamoxifen-induced uterine cancer, status post surgical resection     March 1998 with a hysterectomy. 3. Osteoporosis with failure on Fosamax, still on calcium and vitamin     D. 4. Recent herpes zoster of the C6-C7 dermatome area on the left.  The     rash is resolving and she has no pain she states. 5. Left shoulder fracture with repair by Dr. Jerl Santos, still with     significant decreased range of motion. 6. A colonoscopy in January 2003. 7. Benign mole removed from left axilla in 2009.  [name] is living in the [place] area now.  She is doing well but still under some stress at home with her son-in-law's brother, with whom they are living, has Alzheimer's and is becoming almost total care.  She was also stressed out by a flu-like illness in January it sounds like, and then about 2-3 weeks ago, she went to the beach, had left arm pain the entire 10 days she was there but the rash broke out only on the way home from the beach she states.  The good news is that she does not have any tingling to speak of or numbness or burning pain any more.  Other than that, her bowels are working well.  She has had mammography, most recently on 02/03/2012, and that was negative.  PHYSICAL EXAMINATION:  Stable vital signs.  She is soon going to be 78 years old.  She does not look her age.  Her mind is extremely clear. Also note that she had a Pap smear by Dr. Eda Paschal which was negative last month as well.  She has no adenopathy.  She still has markedly decreased range of motion of the left shoulder.  Her lungs, though,  are clear.  Heart shows a regular rhythm and rate with an occasional premature contraction.  No S3 gallop.  I did not hear a murmur.  She has no suspicious skin lesions today.  Both breasts are negative for masses. The right is more thickened than the left.  Her abdomen is soft and nontender without organomegaly.  She has no peripheral edema.  The scars on the left upper arm extending down to the left middle finger are all consistent with zoster changes.  She had a couple only in the left upper back, which are almost totally faded.  So she looks great.  We will see her back in a year.  Will do a CBC, differential, and CMET today.    ______________________________ [name]. Mariel Sleet, MD ESN/MEDQ  D:  03/07/2012  T:  03/07/2012  Job:  [id number]

## 2012-03-07 NOTE — Progress Notes (Signed)
This office note has been dictated.

## 2013-02-05 ENCOUNTER — Encounter: Payer: Medicare Other | Admitting: Gynecology

## 2013-03-06 ENCOUNTER — Encounter (HOSPITAL_COMMUNITY): Payer: Medicare Other | Attending: Oncology | Admitting: Oncology

## 2013-03-06 ENCOUNTER — Encounter (HOSPITAL_COMMUNITY): Payer: Self-pay | Admitting: Oncology

## 2013-03-06 VITALS — BP 186/69 | HR 79 | Temp 97.4°F | Resp 18 | Wt 124.0 lb

## 2013-03-06 DIAGNOSIS — M899 Disorder of bone, unspecified: Secondary | ICD-10-CM

## 2013-03-06 DIAGNOSIS — Z8542 Personal history of malignant neoplasm of other parts of uterus: Secondary | ICD-10-CM

## 2013-03-06 DIAGNOSIS — C55 Malignant neoplasm of uterus, part unspecified: Secondary | ICD-10-CM

## 2013-03-06 DIAGNOSIS — C50919 Malignant neoplasm of unspecified site of unspecified female breast: Secondary | ICD-10-CM

## 2013-03-06 DIAGNOSIS — Z853 Personal history of malignant neoplasm of breast: Secondary | ICD-10-CM

## 2013-03-06 DIAGNOSIS — L821 Other seborrheic keratosis: Secondary | ICD-10-CM

## 2013-03-06 NOTE — Progress Notes (Signed)
#  1 history of bilateral breast cancers, both stage I, grade 1, right diagnosed in June 1995, the left in 1996, both treated by lumpectomy followed by radiation therapy [facility]. She did take tamoxifen for several years but stopped it due to the development of uterine cancer 1998. #2 tamoxifen induced uterine cancer status post surgical resection in March of 1998. She had a hysterectomy at that time she's had no evidence recurrent disease. #3 herpes zoster the C6-C7 dermatome on the left. She is reluctant to take the zoster vaccine. She has no pain presently. She is also had zoster in the past on the right T1 dermatome. #4 left shoulder fracture was repaired by Dr. Kirt Boys still with significant decreased range of motion #5 colonoscopy January 2003 #6 benign mole removed from the left axilla 2009 #7 osteoporosis with a year of Fosamax but she is taking calcium and vitamin D.  She still has her one glass of wine per night. She does very very well. She has no complaints other than last night she [place] in [place] [employer] with her heart beating very hard but not fast. She did not have shortness of breath, sweating, any chest pain. She has no radiation of anything Greggory Stallion all etc. She took 2 baby aspirin went back to bed and fell asleep.. She awoke feeling fine today. Her oncology review of systems otherwise is noncontributory. When she does tell me is issue red and disturbing article last night before she went to bed about radiation-induced heart damage which she was worried about and I suspect that prompted her to awake with this anxiety over the fact that she has had radiation to both breasts and possibly some radiation to the heart when she had the radiation to the left breast.  Vital signs are stable. Lymph nodes are negative throughout. The right breast remains slightly more full and indurated but that's not new or different. The left breast is negative for masses as is the right  breast. There is no skin changes that are new or different. She has some benign seborrheic keratoses. Lungs are clear to auscultation and percussion. She does not have neck vein distention. Heart shows a regular rhythm and rate without murmur rub or gallop. Abdomen remains soft and nontender without hepatosplenomegaly. Bowel sounds are normal. She has no arm or leg edema. She is very alert and oriented.  We will see her back in one year. She is to see Dr. Lorain Childes the first week in May for lab work and mammography etc. so we will not drawing blood work today. Only if she has more cardiac symptomatology should she be seen sooner in my opinion. Thus far she has no evidence recurrent disease of the uterus or breast cancers.

## 2013-03-27 ENCOUNTER — Ambulatory Visit (INDEPENDENT_AMBULATORY_CARE_PROVIDER_SITE_OTHER): Payer: Medicare Other | Admitting: Gynecology

## 2013-03-27 ENCOUNTER — Other Ambulatory Visit (HOSPITAL_COMMUNITY)
Admission: RE | Admit: 2013-03-27 | Discharge: 2013-03-27 | Disposition: A | Discharge status: Home / Self Care | Payer: Medicare Other | Source: Ambulatory Visit | Attending: Gynecology | Admitting: Gynecology

## 2013-03-27 ENCOUNTER — Encounter: Payer: Self-pay | Admitting: Gynecology

## 2013-03-27 VITALS — BP 136/88 | Ht 59.75 in | Wt 122.0 lb

## 2013-03-27 DIAGNOSIS — Z01419 Encounter for gynecological examination (general) (routine) without abnormal findings: Secondary | ICD-10-CM | POA: Insufficient documentation

## 2013-03-27 DIAGNOSIS — Z1151 Encounter for screening for human papillomavirus (HPV): Secondary | ICD-10-CM | POA: Insufficient documentation

## 2013-03-27 DIAGNOSIS — M4120 Other idiopathic scoliosis, site unspecified: Secondary | ICD-10-CM

## 2013-03-27 DIAGNOSIS — Z124 Encounter for screening for malignant neoplasm of cervix: Secondary | ICD-10-CM

## 2013-03-27 DIAGNOSIS — Z853 Personal history of malignant neoplasm of breast: Secondary | ICD-10-CM

## 2013-03-27 DIAGNOSIS — Z780 Asymptomatic menopausal state: Secondary | ICD-10-CM

## 2013-03-27 DIAGNOSIS — Z1272 Encounter for screening for malignant neoplasm of vagina: Secondary | ICD-10-CM

## 2013-03-27 DIAGNOSIS — N952 Postmenopausal atrophic vaginitis: Secondary | ICD-10-CM

## 2013-03-27 DIAGNOSIS — Z8542 Personal history of malignant neoplasm of other parts of uterus: Secondary | ICD-10-CM

## 2013-03-27 DIAGNOSIS — M810 Age-related osteoporosis without current pathological fracture: Secondary | ICD-10-CM

## 2013-03-27 NOTE — Progress Notes (Addendum)
[name] [birth year] [id number]   History:    79 y.o.for followup as a result of her past history of uterine cancer. Review of her past surgical history as follows:  Patient with bilateral breast cancer, both stage I, both grade 1. Right-sided breast cancer was in June of 1995, left-sided breast cancer was in 1996. She had lumpectomies on both breasts followed by radiation therapy in [facility]. Patient didn't have tamoxifen. Patient then developed tamoxifen-induced uterine cancer and had a total abdominal hysterectomy bilateralsalpingo- oophorectomy in 1998 stage 1. No further treatment. Patient had a normal colonoscopy in 2010. Her last bone density study was in 2004. Her last mammogram was in 2014. Patient's last Pap smear 2013 reported to be normal. Dr. Mariel Sleet her oncologist does her breast exam which she saw her recently.  Past medical history,surgical history, family history and social history were all reviewed and documented in the EPIC chart.  Gynecologic History No LMP recorded. Patient has had a hysterectomy. Contraception: post menopausal status, prior hysterectomy Last Pap: 2013. Results were: normal Last mammogram: 2014. Results were: normal  Obstetric History OB History   Grav Para Term Preterm Abortions TAB SAB Ect Mult Living   3 3 3       3      # Outc Date GA Lbr Len/2nd Wgt Sex Del Anes PTL Lv   1 TRM            2 TRM            3 TRM                ROS: A ROS was performed and pertinent positives and negatives are included in the history.  GENERAL: No fevers or chills. HEENT: No change in vision, no earache, sore throat or sinus congestion. NECK: No pain or stiffness. CARDIOVASCULAR: No chest pain or pressure. No palpitations. PULMONARY: No shortness of breath, cough or wheeze. GASTROINTESTINAL: No abdominal pain, nausea, vomiting or diarrhea, melena or bright red blood per rectum. GENITOURINARY: No urinary frequency, urgency, hesitancy or  dysuria. MUSCULOSKELETAL: No joint or muscle pain, no back pain, no recent trauma. DERMATOLOGIC: No rash, no itching, no lesions. ENDOCRINE: No polyuria, polydipsia, no heat or cold intolerance. No recent change in weight. HEMATOLOGICAL: No anemia or easy bruising or bleeding. NEUROLOGIC: No headache, seizures, numbness, tingling or weakness. PSYCHIATRIC: No depression, no loss of interest in normal activity or change in sleep pattern.     Exam: chaperone present  BP 136/88  Ht 4' 11.75" (1.518 m)  Wt 122 lb (55.339 kg)  BMI 24.02 kg/m2  Body mass index is 24.02 kg/(m^2).  General appearance : Well developed well nourished female. No acute distress HEENT: Neck supple, trachea midline, no carotid bruits, no thyroidmegaly Lungs: Clear to auscultation, no rhonchi or wheezes, or rib retractions  Heart: Regular rate and rhythm, no murmurs or gallops Breast:Examined not examined sister was recently exam in April of this year by her oncologist. Abdomen: no palpable masses or tenderness, no rebound or guarding Extremities: no edema or skin discoloration or tenderness Back: Scoliosis  Pelvic:  Bartholin, Urethra, Skene Glands: Within normal limits             Vagina: No gross lesions or discharge  Cervix: absent  Uterus Absent  Adnexa  Without masses or tenderness  Anus and perineum  normal   Rectovaginal  normal sphincter tone without palpated masses or tenderness  Hemoccult card provided     Assessment/Plan:  79 y.o. with prior history of early stage breast cancer bilateral resulting in lumpectomies at different times. Patient subsequently developed tamoxifen-induced uterine cancer and had a TAH BSO early stage require no additional therapy. Pap smear was done today. She will schedule her bone density study. She was given Hemoccult card to submit to the office for testing. Her primary physician is Dr. Jacky Kindle who recently did her lab work and she will be seen him this week. No  breast exam was done because her oncologist dated on April 16 of this year.    Ok Edwards MD, 6:58 PM 03/27/2013

## 2013-03-29 ENCOUNTER — Encounter: Payer: Self-pay | Admitting: Gynecology

## 2013-04-01 ENCOUNTER — Encounter: Payer: Self-pay | Admitting: Gynecology

## 2013-04-03 ENCOUNTER — Other Ambulatory Visit: Payer: Self-pay | Admitting: Radiology

## 2013-04-03 ENCOUNTER — Encounter: Payer: Self-pay | Admitting: Gynecology

## 2013-04-05 ENCOUNTER — Telehealth (HOSPITAL_COMMUNITY): Payer: Self-pay | Admitting: Oncology

## 2013-04-09 ENCOUNTER — Telehealth: Payer: Self-pay

## 2013-04-09 NOTE — Telephone Encounter (Signed)
Tell her that we were justjust checking to make sure that she is getting her followup appointment with the right people as was recommended

## 2013-04-09 NOTE — Telephone Encounter (Signed)
I called patient's daughter, Jonna Clark, because Dr. Glenetta Hew asked me to make sure that patient had appt with Dr. Mariel Sleet.  Daughter said Dr. Mariel Sleet is out of office x two weeks. Mom has appt with Dr. Jamey Ripa 04/19/13 as patient desires lumpectomy at this time as she just wants to know now what is going on.  She saw Dr. Tilda Burrow at [place] and had biopsy.  No Dr. Demetrios Isaacs appt planned at this time.

## 2013-04-10 NOTE — Telephone Encounter (Signed)
Advised 

## 2013-04-18 ENCOUNTER — Ambulatory Visit (INDEPENDENT_AMBULATORY_CARE_PROVIDER_SITE_OTHER): Payer: Medicare Other

## 2013-04-18 DIAGNOSIS — M810 Age-related osteoporosis without current pathological fracture: Secondary | ICD-10-CM

## 2013-04-18 DIAGNOSIS — Z780 Asymptomatic menopausal state: Secondary | ICD-10-CM

## 2013-04-19 ENCOUNTER — Ambulatory Visit (INDEPENDENT_AMBULATORY_CARE_PROVIDER_SITE_OTHER): Payer: Medicare Other | Admitting: Surgery

## 2013-04-19 ENCOUNTER — Encounter (INDEPENDENT_AMBULATORY_CARE_PROVIDER_SITE_OTHER): Payer: Self-pay | Admitting: Surgery

## 2013-04-19 VITALS — BP 140/82 | HR 66 | Temp 97.3°F | Resp 18 | Ht 59.0 in | Wt 121.0 lb

## 2013-04-19 DIAGNOSIS — N62 Hypertrophy of breast: Secondary | ICD-10-CM

## 2013-04-19 DIAGNOSIS — N6099 Unspecified benign mammary dysplasia of unspecified breast: Secondary | ICD-10-CM

## 2013-04-19 NOTE — Patient Instructions (Signed)
Currently I think the best option is to get a repeat mammogram in about 6 months, but I will review with Dr Mariel Sleet and the pathologist and get back with you

## 2013-04-26 ENCOUNTER — Telehealth (INDEPENDENT_AMBULATORY_CARE_PROVIDER_SITE_OTHER): Payer: Self-pay | Admitting: Surgery

## 2013-04-26 NOTE — Telephone Encounter (Signed)
I spoke with her today to followup after hearing back from Dr. Laurie Panda. We recommended that she have a followup mammogram in several months and she was happy with that result. Depending on the findings at that time we'll make other plans.

## 2013-04-26 NOTE — Progress Notes (Signed)
NAME: [name]       DOB: Jul 24, 1920           DATE: 04/19/2013       WUJ:[id number]  CC:  Chief Complaint  Patient presents with  . Breast Problem    ADH    HPI: patient recently had an abnormality seen on mammogram and a needle core biopsy has shown minimal atypical ductal hyperplasia.she has a history of bilateral breast cancers treated in the mid 90s. She's had no evidence of recurrence. We are asked to see her because of this recent finding.  EXAM: Vital signs: BP 140/82  Pulse 66  Temp(Src) 97.3 F (36.3 C) (Oral)  Resp 18  Ht 4\' 11"  (1.499 m)  Wt 121 lb (54.885 kg)  BMI 24.43 kg/m2  General: Patient alert, oriented, NAD  Breast: There is no evidence of mass bleed or side. She is status post bilateral lumpectomies. There is no suggestion of any recurrences.  Lymphatics: There is no evidence of axillary or supraclavicular adenopathy on either side.  Data reviewed: Mammogram: A few new microcalcifications noted. Pathology: Diagnosis Breast, left, needle core biopsy - FIBROCYSTIC CHANGES WITH FOCAL ATYPICAL DUCTAL HYPERPLASIA. Microscopic Comment There are large areas of dense fibrosis consistent with fibrocystic changes. There are focal ducts with features of atypical ductal hyperplasia and the diagnosis is supported by immunohistochemistry with CK 5/6. Immunohistochemistry with E-Cadherin shows strong positivity confirming the ductal nature of the lesion and basal cell markers for p63, Calponin and smooth muscle myosin are positive. Dr. Colonel Bald has reviewed this case and agrees. The results were called to [facility] on 04/04/13 and 04/05/13. (JDP:kh 04/05/13) Jimmy Picket MD Pathologist, Electronic Signature (Case signed 04/05/2013)  IMP: focal atypical hyperplasia on recent core biopsy with history of bilateral breast cancers  PLAN: I have recommended that she have followup mammograms rather than excisional biopsy at this point. The atypical hyperplasia  was very minimal just focal and given her overall age and situation I think followup this point would be easiest and most appropriate. After she left I was able to discuss this with her oncologist, Dr. Glenford Peers and he was in agreement with the plan for followup rather than surgical excision at this point.  Sinclair Alligood J 04/26/2013

## 2013-05-14 ENCOUNTER — Other Ambulatory Visit: Payer: Self-pay | Admitting: *Deleted

## 2013-05-14 ENCOUNTER — Other Ambulatory Visit: Payer: Medicare Other

## 2013-05-14 DIAGNOSIS — M810 Age-related osteoporosis without current pathological fracture: Secondary | ICD-10-CM

## 2013-05-14 NOTE — Addendum Note (Signed)
Addended by: Richardson Chiquito on: 05/14/2013 03:04 PM   Modules accepted: Orders

## 2013-05-14 NOTE — Addendum Note (Signed)
Addended by: Ok Edwards on: 05/14/2013 02:55 PM   Modules accepted: Orders

## 2013-05-15 LAB — VITAMIN D 25 HYDROXY (VIT D DEFICIENCY, FRACTURES): Vit D, 25-Hydroxy: 56 ng/mL (ref 30–89)

## 2013-05-16 ENCOUNTER — Institutional Professional Consult (permissible substitution): Payer: Medicare Other | Admitting: Gynecology

## 2013-05-16 ENCOUNTER — Telehealth: Payer: Self-pay | Admitting: Gynecology

## 2013-05-16 ENCOUNTER — Telehealth: Payer: Self-pay | Admitting: *Deleted

## 2013-05-16 NOTE — Telephone Encounter (Signed)
Pt was scheduled for consult today discuss dexa & lab results, her daughter comes with her to appointments and asked if there is anyway you could [name] (daughter) on phone at [phone] and discuss results, rather that bringing her 79 year old mother to office again? I told her I wasn't  Sure, and I would relay the message. Please advise

## 2013-05-16 NOTE — Telephone Encounter (Signed)
I spoke today with [name] daughter who is her legal guardian in reference to her mother's bone density study since it becomes difficult to try to bring her to the office to discuss results. The following was discussed:  Bone density study done and agrees for gynecology on May 29. No previous studies for comparison. Prior study done many years ago in [place], [place]. Daughter stated that previous bone density study had not mentioned any evidence of osteoporosis.  Patient's distal one third of right forearm T score -5.2 Total right and left hip with T score of -2.5 and -2.6 respectively  Patient's recent vitamin D level was normal. Patient's bone masses 25% below normal her risk of spinal fracture is a times greater than the general population and 11 times greater risk of hip fracture when compared to the general population as well. Her age and low body weight and being postmenopausal. Additional risk factors for fracture.  Because of patient's age and the severity of her osteoporosis I have recommended she go on Prolia (monoclonal antibody) 60 mg subcutaneous Q6 months. We discussed also the importance of calcium vitamin D. Will then repeat a bone density study in one year to monitor response to treatment. We will check her calcium as well as BUN and creatinine before starting the Prolia. The potential adverse affects from the medication were discussed to include osteonecrosis of the jaw as well as spontaneous sub-trochanteric fractures of the hip. We will give her additional information when she comes to the office for an injection or to get her blood drawn.

## 2013-05-22 NOTE — Telephone Encounter (Signed)
JF SPOKE WITH PT DAUGHTER REGARDING THE BELOW

## 2013-06-07 ENCOUNTER — Encounter: Payer: Self-pay | Admitting: Oncology

## 2013-06-18 ENCOUNTER — Telehealth: Payer: Self-pay | Admitting: *Deleted

## 2013-06-18 NOTE — Telephone Encounter (Signed)
Dr Lily Peer requested Prolia benefits be checked on the patient and I did. It does cover it with $0 out of pocket as long as her Medicare deductible has been paid. They want to proceed. Prolia inj apt tomorrow at 2pm. Renette Butters

## 2013-06-19 ENCOUNTER — Ambulatory Visit: Payer: Medicare Other

## 2013-06-19 NOTE — Telephone Encounter (Signed)
Pt called back and stated that she does not want to take the Prolia injection.She states too many side effects KW

## 2013-08-27 ENCOUNTER — Other Ambulatory Visit (INDEPENDENT_AMBULATORY_CARE_PROVIDER_SITE_OTHER): Payer: Self-pay | Admitting: *Deleted

## 2013-08-27 ENCOUNTER — Telehealth (INDEPENDENT_AMBULATORY_CARE_PROVIDER_SITE_OTHER): Payer: Self-pay | Admitting: *Deleted

## 2013-08-27 DIAGNOSIS — Z87898 Personal history of other specified conditions: Secondary | ICD-10-CM

## 2013-08-27 NOTE — Telephone Encounter (Signed)
Spoke with pts daughter Gavin Pound regarding her appt for her MM at [place] on 10/09/13 at 1:15.  Also informed her of her mothers appt with Dr. Jamey Ripa on 10/28/13 at 9:15am for follow up.

## 2013-08-27 NOTE — Telephone Encounter (Signed)
Received a call from patient's daughter today after the front desk had called her to schedule a 4 mo f/u appt with Dr. Jamey Ripa.  Daughter is asking why patient would need to be seen again if no surgery has been done and a repeat MM has not been done yet.  I spoke to Dr. Jamey Ripa in office who states patient will need a MM diagnostic 6 mo from last abnormal MM.  He states once that is scheduled then we can schedule an appt with him about a week after so he can discuss results with patient and daughter.  Order placed at this time and given to [facility] RN to setup appts.  Daughter states understanding of the plan and agreeable at this time.  Daughter knows she is awaiting a call with the appt date and time for MM and Dr. Jamey Ripa.

## 2013-09-24 ENCOUNTER — Ambulatory Visit (INDEPENDENT_AMBULATORY_CARE_PROVIDER_SITE_OTHER): Payer: Medicare Other | Admitting: Surgery

## 2013-10-10 ENCOUNTER — Encounter: Payer: Self-pay | Admitting: Gynecology

## 2013-10-28 ENCOUNTER — Encounter (INDEPENDENT_AMBULATORY_CARE_PROVIDER_SITE_OTHER): Payer: Self-pay | Admitting: Surgery

## 2013-10-28 ENCOUNTER — Telehealth (INDEPENDENT_AMBULATORY_CARE_PROVIDER_SITE_OTHER): Payer: Self-pay | Admitting: *Deleted

## 2013-10-28 ENCOUNTER — Telehealth (INDEPENDENT_AMBULATORY_CARE_PROVIDER_SITE_OTHER): Payer: Self-pay | Admitting: General Surgery

## 2013-10-28 ENCOUNTER — Ambulatory Visit (INDEPENDENT_AMBULATORY_CARE_PROVIDER_SITE_OTHER): Payer: Medicare Other | Admitting: Surgery

## 2013-10-28 ENCOUNTER — Encounter (INDEPENDENT_AMBULATORY_CARE_PROVIDER_SITE_OTHER): Payer: Self-pay

## 2013-10-28 VITALS — BP 140/82 | HR 80 | Temp 98.4°F | Resp 14 | Ht 60.0 in | Wt 118.8 lb

## 2013-10-28 DIAGNOSIS — R928 Other abnormal and inconclusive findings on diagnostic imaging of breast: Secondary | ICD-10-CM

## 2013-10-28 DIAGNOSIS — C50919 Malignant neoplasm of unspecified site of unspecified female breast: Secondary | ICD-10-CM

## 2013-10-28 DIAGNOSIS — Z853 Personal history of malignant neoplasm of breast: Secondary | ICD-10-CM

## 2013-10-28 DIAGNOSIS — R921 Mammographic calcification found on diagnostic imaging of breast: Secondary | ICD-10-CM

## 2013-10-28 NOTE — Progress Notes (Signed)
NAME: [name]       DOB: January 03, 1920           DATE: 10/28/2013       ZOX:[id number]  CC:  Chief Complaint  Patient presents with  . Breast Cancer Long Term Follow Up    f/u    HPI: Patient had an abnormality seen on mammogram and a needle core biopsy has shown minimal atypical ductal hyperplasia.she has a history of bilateral breast cancers treated in the mid 90s. She's had no evidence of recurrence. We saw her after the NCB, buit elected no surgery. She has had no interval problems EXAM: Vital signs: BP 140/82  Pulse 80  Temp(Src) 98.4 F (36.9 C) (Temporal)  Resp 14  Ht 5' (1.524 m)  Wt 118 lb 12.8 oz (53.887 kg)  BMI 23.20 kg/m2  General: Patient alert, oriented, NAD  Breast: The breasts are diffusely irregular, no dominant mass and nothing to suggest recurrence Lymphatics: There is no evidence of axillary or supraclavicular adenopathy on either side.  Data reviewed: Mammogram:  Done in Novmeber at [facility], stable calcs noted IMP: focal atypical hyperplasia on recent core biopsy with history of bilateral breast cancers stable mammogram  PLAN:She will see [place] again PRN, but continue to have annual mammograms  Bron Snellings J 10/28/2013

## 2013-10-28 NOTE — Telephone Encounter (Signed)
Order placed and given to referral coordinator.

## 2013-10-28 NOTE — Patient Instructions (Signed)
Continue annual mammograms We will see you again on an as needed basis. Please call the office at [phone] if you have any questions or concerns. Thank you for allowing us to take care of you.  

## 2013-10-28 NOTE — Telephone Encounter (Signed)
I spoke with [name] who is wanting to follow Dr. Mariel Sleet to [facility] for her yearly follow ups.  Per [name] she is due to be seen for a follow up appt in April 2015.  I called over to [facility] and they are not currently scheduling for April yet so they instructed me to fax over the referral and notes and they would contact the patient when their schedule for April comes available.  I have faxed notes and the referral order and notified [name] that they would be contacting her to schedule an appt.

## 2013-10-28 NOTE — Telephone Encounter (Signed)
Message copied by Liliana Cline on Mon Oct 28, 2013  1:37 PM ------      Message from: Fatima Sanger      Created: Mon Oct 28, 2013 11:14 AM       Pt called and is requesting an oncology referral to Dr. Glenford Peers at [facility] in [place].             Thank you,      Stevie  ------

## 2014-03-07 ENCOUNTER — Ambulatory Visit (HOSPITAL_COMMUNITY): Payer: Medicare Other

## 2014-03-09 ENCOUNTER — Inpatient Hospital Stay (HOSPITAL_COMMUNITY)
Admission: EM | Admit: 2014-03-09 | Discharge: 2014-03-11 | DRG: 863 | Disposition: A | Discharge status: Skilled Nursing Facility | Payer: Medicare Other | Attending: Internal Medicine | Admitting: Internal Medicine

## 2014-03-09 ENCOUNTER — Encounter (HOSPITAL_COMMUNITY): Payer: Self-pay | Admitting: Emergency Medicine

## 2014-03-09 DIAGNOSIS — M129 Arthropathy, unspecified: Secondary | ICD-10-CM | POA: Diagnosis present

## 2014-03-09 DIAGNOSIS — Y92009 Unspecified place in unspecified non-institutional (private) residence as the place of occurrence of the external cause: Secondary | ICD-10-CM

## 2014-03-09 DIAGNOSIS — R269 Unspecified abnormalities of gait and mobility: Secondary | ICD-10-CM | POA: Diagnosis present

## 2014-03-09 DIAGNOSIS — L02619 Cutaneous abscess of unspecified foot: Secondary | ICD-10-CM | POA: Diagnosis present

## 2014-03-09 DIAGNOSIS — M7989 Other specified soft tissue disorders: Secondary | ICD-10-CM

## 2014-03-09 DIAGNOSIS — Z853 Personal history of malignant neoplasm of breast: Secondary | ICD-10-CM

## 2014-03-09 DIAGNOSIS — L03119 Cellulitis of unspecified part of limb: Secondary | ICD-10-CM

## 2014-03-09 DIAGNOSIS — L03116 Cellulitis of left lower limb: Secondary | ICD-10-CM

## 2014-03-09 DIAGNOSIS — R2681 Unsteadiness on feet: Secondary | ICD-10-CM | POA: Diagnosis present

## 2014-03-09 DIAGNOSIS — Y838 Other surgical procedures as the cause of abnormal reaction of the patient, or of later complication, without mention of misadventure at the time of the procedure: Secondary | ICD-10-CM | POA: Diagnosis present

## 2014-03-09 DIAGNOSIS — Z9181 History of falling: Secondary | ICD-10-CM

## 2014-03-09 DIAGNOSIS — T8140XA Infection following a procedure, unspecified, initial encounter: Principal | ICD-10-CM | POA: Diagnosis present

## 2014-03-09 DIAGNOSIS — L0390 Cellulitis, unspecified: Secondary | ICD-10-CM | POA: Diagnosis present

## 2014-03-09 DIAGNOSIS — M79609 Pain in unspecified limb: Secondary | ICD-10-CM

## 2014-03-09 DIAGNOSIS — S72033A Displaced midcervical fracture of unspecified femur, initial encounter for closed fracture: Secondary | ICD-10-CM | POA: Diagnosis present

## 2014-03-09 DIAGNOSIS — D649 Anemia, unspecified: Secondary | ICD-10-CM | POA: Diagnosis present

## 2014-03-09 DIAGNOSIS — Z803 Family history of malignant neoplasm of breast: Secondary | ICD-10-CM

## 2014-03-09 LAB — CBC
HEMATOCRIT: 32.9 % — AB (ref 36.0–46.0)
Hemoglobin: 10.7 g/dL — ABNORMAL LOW (ref 12.0–15.0)
MCH: 30.6 pg (ref 26.0–34.0)
MCHC: 32.5 g/dL (ref 30.0–36.0)
MCV: 94 fL (ref 78.0–100.0)
Platelets: 362 10*3/uL (ref 150–400)
RBC: 3.5 MIL/uL — ABNORMAL LOW (ref 3.87–5.11)
RDW: 17 % — AB (ref 11.5–15.5)
WBC: 5.6 10*3/uL (ref 4.0–10.5)

## 2014-03-09 LAB — BASIC METABOLIC PANEL
BUN: 13 mg/dL (ref 6–23)
CO2: 26 mEq/L (ref 19–32)
CREATININE: 0.44 mg/dL — AB (ref 0.50–1.10)
Calcium: 8.9 mg/dL (ref 8.4–10.5)
Chloride: 104 mEq/L (ref 96–112)
GFR, EST NON AFRICAN AMERICAN: 86 mL/min — AB (ref >90)
GLUCOSE: 98 mg/dL (ref 70–99)
Potassium: 4 mEq/L (ref 3.7–5.3)
Sodium: 141 mEq/L (ref 137–147)

## 2014-03-09 MED ORDER — ADULT MULTIVITAMIN W/MINERALS CH
1.0000 | ORAL_TABLET | Freq: Every day | ORAL | Status: DC
  Administered 2014-03-09 – 2014-03-11 (×3): 1 via ORAL
  Filled 2014-03-09 (×3): qty 1

## 2014-03-09 MED ORDER — HYDROCODONE-ACETAMINOPHEN 5-325 MG PO TABS
1.0000 | ORAL_TABLET | Freq: Four times a day (QID) | ORAL | Status: DC | PRN
  Administered 2014-03-09 – 2014-03-11 (×7): 1 via ORAL
  Filled 2014-03-09 (×7): qty 1

## 2014-03-09 MED ORDER — CLINDAMYCIN PHOSPHATE 600 MG/50ML IV SOLN
600.0000 mg | Freq: Three times a day (TID) | INTRAVENOUS | Status: DC
  Administered 2014-03-09 – 2014-03-11 (×5): 600 mg via INTRAVENOUS
  Filled 2014-03-09 (×7): qty 50

## 2014-03-09 MED ORDER — ASPIRIN 81 MG PO CHEW
81.0000 mg | CHEWABLE_TABLET | Freq: Every day | ORAL | Status: DC
  Administered 2014-03-09 – 2014-03-11 (×3): 81 mg via ORAL
  Filled 2014-03-09 (×3): qty 1

## 2014-03-09 MED ORDER — PROMETHAZINE HCL 25 MG PO TABS: 12.5000 mg | ORAL_TABLET | Freq: Four times a day (QID) | ORAL | Status: DC | PRN

## 2014-03-09 MED ORDER — CHOLECALCIFEROL 10 MCG (400 UNIT) PO TABS
400.0000 [IU] | ORAL_TABLET | Freq: Every day | ORAL | Status: DC
  Administered 2014-03-09 – 2014-03-11 (×3): 400 [IU] via ORAL
  Filled 2014-03-09 (×3): qty 1

## 2014-03-09 MED ORDER — CALCIUM CARBONATE-VITAMIN D 600-400 MG-UNIT PO TABS
1.0000 | ORAL_TABLET | Freq: Every day | ORAL | Status: DC
  Filled 2014-03-09: qty 1

## 2014-03-09 MED ORDER — ENOXAPARIN SODIUM 40 MG/0.4ML ~~LOC~~ SOLN
40.0000 mg | SUBCUTANEOUS | Status: DC
  Administered 2014-03-09 – 2014-03-10 (×2): 40 mg via SUBCUTANEOUS
  Filled 2014-03-09 (×4): qty 0.4

## 2014-03-09 MED ORDER — CLINDAMYCIN PHOSPHATE 300 MG/50ML IV SOLN: 300.0000 mg | Freq: Two times a day (BID) | INTRAVENOUS | Status: DC

## 2014-03-09 MED ORDER — BISACODYL 10 MG RE SUPP: 10.0000 mg | Freq: Every day | RECTAL | Status: DC | PRN

## 2014-03-09 MED ORDER — VITAMIN E 180 MG (400 UNIT) PO CAPS
400.0000 [IU] | ORAL_CAPSULE | Freq: Every day | ORAL | Status: DC
  Administered 2014-03-09 – 2014-03-11 (×3): 400 [IU] via ORAL
  Filled 2014-03-09 (×3): qty 1

## 2014-03-09 MED ORDER — CENTRUM SILVER ULTRA WOMENS PO TABS: 1.0000 | ORAL_TABLET | Freq: Every morning | ORAL | Status: DC

## 2014-03-09 MED ORDER — SODIUM CHLORIDE 0.9 % IV SOLN
Freq: Once | INTRAVENOUS | Status: AC
  Administered 2014-03-09: 14:00:00 via INTRAVENOUS

## 2014-03-09 MED ORDER — CLINDAMYCIN PHOSPHATE 600 MG/50ML IV SOLN
600.0000 mg | Freq: Three times a day (TID) | INTRAVENOUS | Status: DC
  Filled 2014-03-09 (×3): qty 50

## 2014-03-09 MED ORDER — CALCIUM CARBONATE-VITAMIN D 500-200 MG-UNIT PO TABS
1.0000 | ORAL_TABLET | Freq: Every day | ORAL | Status: DC
  Administered 2014-03-09 – 2014-03-11 (×3): 1 via ORAL
  Filled 2014-03-09 (×5): qty 1

## 2014-03-09 MED ORDER — POTASSIUM CHLORIDE IN NACL 20-0.9 MEQ/L-% IV SOLN
INTRAVENOUS | Status: DC
Start: 2014-03-09 — End: 2014-03-11
  Administered 2014-03-09: 17:00:00 via INTRAVENOUS
  Filled 2014-03-09 (×2): qty 1000

## 2014-03-09 MED ORDER — SENNOSIDES-DOCUSATE SODIUM 8.6-50 MG PO TABS: 1.0000 | ORAL_TABLET | Freq: Every evening | ORAL | Status: DC | PRN

## 2014-03-09 MED ORDER — CRANBERRY 475 MG PO CAPS: 2.0000 | ORAL_CAPSULE | Freq: Two times a day (BID) | ORAL | Status: DC

## 2014-03-09 MED ORDER — ALUM & MAG HYDROXIDE-SIMETH 200-200-20 MG/5ML PO SUSP: 30.0000 mL | Freq: Four times a day (QID) | ORAL | Status: DC | PRN

## 2014-03-09 MED ORDER — ZOLPIDEM TARTRATE 5 MG PO TABS
5.0000 mg | ORAL_TABLET | Freq: Every evening | ORAL | Status: DC | PRN
Start: 2014-03-09 — End: 2014-03-11
  Administered 2014-03-09 – 2014-03-10 (×2): 5 mg via ORAL
  Filled 2014-03-09 (×2): qty 1

## 2014-03-09 MED ORDER — POLYSACCHARIDE IRON COMPLEX 150 MG PO CAPS
150.0000 mg | ORAL_CAPSULE | Freq: Every day | ORAL | Status: DC
  Administered 2014-03-09 – 2014-03-11 (×3): 150 mg via ORAL
  Filled 2014-03-09 (×3): qty 1

## 2014-03-09 MED ORDER — CLINDAMYCIN PHOSPHATE 600 MG/50ML IV SOLN
600.0000 mg | Freq: Once | INTRAVENOUS | Status: AC
  Administered 2014-03-09: 600 mg via INTRAVENOUS
  Filled 2014-03-09: qty 50

## 2014-03-09 NOTE — ED Notes (Signed)
Pt. Had lt. Hip surgery 2 weeks ago at [facility].  Has been recovering however her lt. Foot has been bruised and swollen during the recovery,  Yesterday she began to develop redness and warmth to touch and increased pain.  At this time she is unable to bear weight.

## 2014-03-09 NOTE — H&P (Signed)
[name] is an 80 y.o. female.   Chief Complaint: left leg is swollen and sore HPI:  The patient is a 80 year old Caucasian woman who was in her usual state of good health until 14 days ago when she had a fall and landed on the left hip. This resulted in immediate pain and she was transported to an emergency room in the [facility] area for evaluation. She was found to have a left hip fracture and underwent an uneventful open reduction with internal fixation. She then had a brief inpatient rehabilitation in the hospital and returned yesterday to her home where she lives with her daughter and son-in-law. Since returned she has noted that her left leg edema and erythema has increased along with tenderness, and she's had difficulty ambulating in the home, despite use of walker, because of left hip pain. She has not had fever, chills, shortness of breath, chest pain, palpitations, or diarrhea. She's had mild constipation.  Past Medical History  Diagnosis Date  . Cancer      Adeno CA of uterus I  . Cancer     Breast cancer X 2  . Breast cancer   . Uterine cancer   . Arthritis   . History of shingles 02/16/12     (Not in a hospital admission)  ADDITIONAL HOME MEDICATIONS: No additional home medications  PHYSICIANS INVOLVED IN CARE: Burnard Bunting (primary care), Christena Flake (orthopedics)   Past Surgical History  Procedure Laterality Date  . Appendectomy    . Abdominal hysterectomy      TAH BSO  . Shoulder surgery    . Cataract extraction    . Hemorrhoid surgery    . Breast surgery      Lumpectomy right and left breast-Radiation and Tamoxfen  . Oophorectomy      BSO    Family History  Problem Relation Age of Onset  . Hypertension Mother   . Heart disease Mother   . Hypertension Father   . Heart disease Father   . Breast cancer Sister     age 72  . Hypertension Sister   . Diabetes Brother   . Hypertension Brother   . Heart disease Brother      Social History:   reports that she has never smoked. She does not have any smokeless tobacco history on file. She reports that she drinks about 3.5 ounces of alcohol per week. She reports that she does not use illicit drugs.  Allergies:  Allergies  Allergen Reactions  . Penicillins Itching and Swelling     ROS: anemia, ankle swelling and arthritis  PHYSICAL EXAM: Blood pressure 129/57, pulse 77, temperature 97.5 F (36.4 C), temperature source Oral, resp. rate 18, SpO2 100.00%. In general, she is a elderly white woman who was in no apparent distress while lying at 10 elevation head of bed. HEENT exam was within normal limits, neck was supple without jugular venous distention or carotid bruit, chest was clear to auscultation, heart had an irregular rate and rhythm with a systolic ejection murmur grade 1/6 of the left sternal border, abdomen had normal bowel sounds and no hepatosplenomegaly or tenderness, she had 2+ left leg edema with distal erythema and bruising, and the bilateral pedal pulses were normal. She was alert and well oriented with normal affect, she had moderate pain with flexion of the left hip. Otherwise the strength in her extremities was normal. Skin exam was otherwise significant for 3 incisions on the proximal left hip and held by  staples and are clean, dry, and intact.  Results for orders placed during the hospital encounter of 03/09/14 (from the past 48 hour(s))  CBC     Status: Abnormal   Collection Time    03/09/14 11:08 AM      Result Value Ref Range   WBC 5.6  4.0 - 10.5 K/uL   RBC 3.50 (*) 3.87 - 5.11 MIL/uL   Hemoglobin 10.7 (*) 12.0 - 15.0 g/dL   HCT 32.9 (*) 36.0 - 46.0 %   MCV 94.0  78.0 - 100.0 fL   MCH 30.6  26.0 - 34.0 pg   MCHC 32.5  30.0 - 36.0 g/dL   RDW 17.0 (*) 11.5 - 15.5 %   Platelets 362  150 - 400 K/uL  BASIC METABOLIC PANEL     Status: Abnormal   Collection Time    03/09/14 11:08 AM      Result Value Ref Range   Sodium 141  137 - 147 mEq/L   Potassium 4.0   3.7 - 5.3 mEq/L   Chloride 104  96 - 112 mEq/L   CO2 26  19 - 32 mEq/L   Glucose, Bld 98  70 - 99 mg/dL   BUN 13  6 - 23 mg/dL   Creatinine, Ser 0.44 (*) 0.50 - 1.10 mg/dL   Calcium 8.9  8.4 - 10.5 mg/dL   GFR calc non Af Amer 86 (*) >90 mL/min   GFR calc Af Amer >90  >90 mL/min   Comment: (NOTE)     The eGFR has been calculated using the CKD EPI equation.     This calculation has not been validated in all clinical situations.     eGFR's persistently <90 mL/min signify possible Chronic Kidney     Disease.   No results found.  A left lower extremity DVT ultrasound done today showed no evidence of a DVT or Baker's cyst   Assessment/Plan #1 Left Leg Cellulitis: Moderately severe we will start clindamycin IV for this. #2 Gait Instability: Mild to moderate and we will have a physical therapist evaluate her ability to ambulate. She may need short-term skilled nursing facility rehabilitation. #3 Anemia: Mild and from postoperative blood loss, and we will start an iron supplement for this.  Leanna Battles 03/09/2014, 1:27 PM

## 2014-03-09 NOTE — Progress Notes (Signed)
Clinical Social Work Department CLINICAL SOCIAL WORK PLACEMENT NOTE 03/09/2014  Patient:  [name], [name]  Account Number:  [id number] Admit date:  03/09/2014  Clinical Social Worker:  Tilden Fossa, Latanya Presser  Date/time:  03/09/2014 03:01 PM  Clinical Social Work is seeking post-discharge placement for this patient at the following level of care:   SKILLED NURSING   (*CSW will update this form in Epic as items are completed)     Patient/family provided with [facility] Department of Clinical Social Work's list of facilities offering this level of care within the geographic area requested by the patient (or if unable, by the patient's family).  03/09/2014  Patient/family informed of their freedom to choose among providers that offer the needed level of care, that participate in Medicare, Medicaid or managed care program needed by the patient, have an available bed and are willing to accept the patient.    Patient/family informed of [facility]' ownership interest in [facility], as well as of the fact that they are under no obligation to receive care at this facility.  PASARR submitted to EDS on 03/09/2014 PASARR number received from EDS on 03/09/2014  FL2 transmitted to all facilities in geographic area requested by pt/family on  03/09/2014 FL2 transmitted to all facilities within larger geographic area on   Patient informed that his/her managed care company has contracts with or will negotiate with  certain facilities, including the following:     Patient/family informed of bed offers received:   Patient chooses bed at  Physician recommends and patient chooses bed at    Patient to be transferred to  on   Patient to be transferred to facility by   The following physician request were entered in Epic:   Additional Comments:  Tilden Fossa, MSW, [address] [place] [address] Worker [facility] Emergency Dept. [phone]

## 2014-03-09 NOTE — Progress Notes (Signed)
Clinical Social Work Department BRIEF PSYCHOSOCIAL ASSESSMENT 03/09/2014  Patient:  [name], [name]     Account Number:  [id number]     Admit date:  03/09/2014  Clinical Social Worker:  Su Monks  Date/Time:  03/09/2014 01:58 PM  Referred by:  Physician  Date Referred:  03/09/2014 Referred for  SNF Placement   Other Referral:   Interview type:  Patient Other interview type:   Weekend CSW also spoke with patient's daughter and son-in-law who were present at the bedside.    PSYCHOSOCIAL DATA Living Status:  WITH ADULT CHILDREN Admitted from facility:   Level of care:   Primary support name:  Su Grand [phone] Primary support relationship to patient:  CHILD, ADULT Degree of support available:   Strong    CURRENT CONCERNS Current Concerns  Post-Acute Placement   Other Concerns:    SOCIAL WORK ASSESSMENT / PLAN Weekend ED CSW consulted by EDP for possible placement at [facility] for rehab. Weekend ED CSW, alongside CM, met with patient, her daughter, and son-in-law. Patient appeared to be in a pleasant mood, alert and oriented. Daughter states that patient fell earlier this month and broker [place]. Patient [place] and received inpatient rehab before returning home yesterday. Patient lives with daughter and son-in-law.  Patient's leg continued to remain swollen and patient non-weight bearing. Daughter states that they were worried about patient's leg so they brought patient to hospital. Daughter states that [facility] was set up to begin today but it was cancelled due to patient's hospitalization. Patient and family expressed their expectations of patient being transferred to [facility] for rehab when discharged from ED today. Weekend CSW and CM offered patient and family support and explained that patient would not be able to be placed today from the ED and that process could possibly  take a few days. Patient expressed her desire to return home until SNF rehab. CSW offered to make referral to facility to expediate placement process- patient and family agreeable. Daughter expressed her concern about patient returning home NWB. Weekend CSW and CM explained that family's concerns would be shared with MD and plan of care discussed. Family agreeable and thanked CSW/CM for assistance.    Weekend CSW/CM spoke with MD about patient's discharge plan. MD states that patient will be admitted inpatient to receive IV anti-biotics. Weekend CSW will send referral to [facility] and weekday CSW will continue to follow for discharge planning to SNF as necessary.   Assessment/plan status:  Information/Referral to [employer] Other assessment/ plan:   Information/referral to community resources:   Weekend CSW will send referral to [employer] and weekday CSW will continue to follow for discharge planning to SNF as necessary.    PATIENT'S/FAMILY'S RESPONSE TO PLAN OF CARE: Patient and family desires for patient to go to [facility] at discharge for rehab.     Tilden Fossa, MSW, [place] Clinical Social Worker [facility] Emergency Dept. [phone]

## 2014-03-09 NOTE — ED Provider Notes (Signed)
CSN: [id number]     Arrival date & time 03/09/14  8299 History   First MD Initiated Contact with Patient 03/09/14 0957     Chief Complaint  Patient presents with  . Foot Pain     (Consider location/radiation/quality/duration/timing/severity/associated sxs/prior Treatment) Patient is a 80 y.o. female presenting with lower extremity pain. The history is provided by the patient.  Foot Pain This is a new problem. The current episode started more than 1 week ago. The problem occurs constantly. The problem has been gradually worsening. Pertinent negatives include no chest pain, no abdominal pain and no shortness of breath. Nothing aggravates the symptoms. Nothing relieves the symptoms.    Past Medical History  Diagnosis Date  . Cancer      Adeno CA of uterus I  . Cancer     Breast cancer X 2  . Breast cancer   . Uterine cancer   . Arthritis   . History of shingles 02/16/12   Past Surgical History  Procedure Laterality Date  . Appendectomy    . Abdominal hysterectomy      TAH BSO  . Shoulder surgery    . Cataract extraction    . Hemorrhoid surgery    . Breast surgery      Lumpectomy right and left breast-Radiation and Tamoxfen  . Oophorectomy      BSO   Family History  Problem Relation Age of Onset  . Hypertension Mother   . Heart disease Mother   . Hypertension Father   . Heart disease Father   . Breast cancer Sister     age 45  . Hypertension Sister   . Diabetes Brother   . Hypertension Brother   . Heart disease Brother    History  Substance Use Topics  . Smoking status: Never Smoker   . Smokeless tobacco: Not on file  . Alcohol Use: 3.5 oz/week    7 drink(s) per week   OB History   Grav Para Term Preterm Abortions TAB SAB Ect Mult Living   3 3 3       3      Review of Systems  Constitutional: Negative for fever and chills.  Respiratory: Negative for cough and shortness of breath.   Cardiovascular: Negative for chest pain.  Gastrointestinal: Negative for  vomiting and abdominal pain.  All other systems reviewed and are negative.     Allergies  Penicillins  Home Medications   Prior to Admission medications   Medication Sig Start Date End Date Taking? Authorizing Provider  aspirin 81 MG tablet Take 81 mg by mouth daily.    Historical Provider, MD  Calcium Carbonate-Vitamin D (CALCIUM 600+D) 600-400 MG-UNIT per tablet Take 1 tablet by mouth daily.    Historical Provider, MD  Cholecalciferol (VITAMIN D PO) Take 1,000 Units by mouth.     Historical Provider, MD  Cranberry 475 MG CAPS Take 2 capsules by mouth.    Historical Provider, MD  MELATONIN PO Take by mouth.    Historical Provider, MD  Multiple Vitamins-Minerals (CENTRUM SILVER ULTRA WOMENS PO) Take 1 tablet by mouth daily.    Historical Provider, MD  vitamin E (VITAMIN E) 400 UNIT capsule Take 400 Units by mouth daily.    Historical Provider, MD   BP 140/66  Pulse 78  Temp(Src) 97.5 F (36.4 C) (Oral)  Resp 18  SpO2 97% Physical Exam  Nursing note and vitals reviewed. Constitutional: She is oriented to person, place, and time. She appears well-developed and well-nourished.  No distress.  HENT:  Head: Normocephalic and atraumatic.  Eyes: EOM are normal. Pupils are equal, round, and reactive to light.  Neck: Normal range of motion. Neck supple.  Cardiovascular: Normal rate and regular rhythm.  Exam reveals no friction rub.   No murmur heard. Pulmonary/Chest: Effort normal and breath sounds normal. No respiratory distress. She has no wheezes. She has no rales.  Abdominal: Soft. She exhibits no distension. There is no tenderness. There is no rebound.  Musculoskeletal: Normal range of motion. She exhibits edema (2+ edema, with ecchymosis, tenderness on L leg. ).  Neurological: She is alert and oriented to person, place, and time.  Skin: She is not diaphoretic.    ED Course  Procedures (including critical care time) Labs Review Labs Reviewed  CBC - Abnormal; Notable for the  following:    RBC 3.50 (*)    Hemoglobin 10.7 (*)    HCT 32.9 (*)    RDW 17.0 (*)    All other components within normal limits  BASIC METABOLIC PANEL - Abnormal; Notable for the following:    Creatinine, Ser 0.44 (*)    GFR calc non Af Amer 86 (*)    All other components within normal limits  CULTURE, BLOOD (ROUTINE X 2)  CULTURE, BLOOD (ROUTINE X 2)  CBC  CREATININE, SERUM    Imaging Review No results found.   EKG Interpretation None      MDM   Final diagnoses:  Left leg cellulitis    40F presents with L foot swelling. 2 weeks post-op from L hip surgery at [facility]. Has been up doing rehab, L foot with decreased but persistent swelling. Now having redness, warmth. No fevers, vomiting, nausea. No systemic symptoms. L foot with strong pulses, swelling in calf below knee including ankle, foot with associated ecchymosis. Concern for DVT vs cellulitis. Will [place], start with labs. DVT US normal. IV antibiotics initiated. Case Management consulted to help with placement. Dr. Philip Aspen with [facility] to see and admit.    Osvaldo Shipper, MD 03/09/14 [phone]

## 2014-03-09 NOTE — Progress Notes (Signed)
Weekend CSW received consult from MD to speak with patient and family about possible SNF placement at [facility]. Per MD, patient has had 30 day qualifying stay. Weekend CSW contacted preferred SNF facility, Admissions Coordinator not available until Monday 03/10/14. Weekend ED CSW can make referral to facility and ask patient/family to follow up on Monday from home. CSW and CM will speak with patient and family regarding care plan.  Tilden Fossa, MSW, [place] Clinical Social Worker [facility] Emergency Dept. [phone]

## 2014-03-09 NOTE — Progress Notes (Signed)
VASCULAR LAB PRELIMINARY  PRELIMINARY  PRELIMINARY  PRELIMINARY  Left lower extremity venous Doppler completed.    Preliminary report:  There is no DVT or SVT noted in the left lower extremity.  Iantha Fallen, RVT 03/09/2014, 10:36 AM

## 2014-03-09 NOTE — Progress Notes (Signed)
PHARMACIST - PHYSICIAN ORDER COMMUNICATION  CONCERNING: P&T Medication Policy on Herbal Medications  DESCRIPTION:  This patient's order for:  Cranberry Capsules has been noted.  This product(s) is classified as an "herbal" or natural product. Due to a lack of definitive safety studies or FDA approval, nonstandard manufacturing practices, plus the potential risk of unknown drug-drug interactions while on inpatient medications, the Pharmacy and Therapeutics Committee does not permit the use of "herbal" or natural products of this type within [facility].   ACTION TAKEN: The pharmacy department is unable to verify this order at this time and your patient has been informed of this safety policy. Please reevaluate patient's clinical condition at discharge and address if the herbal or natural product(s) should be resumed at that time.  Renelda Mom Jacqlyn Larsen, PharmD Clinical Pharmacist - Resident Pager: [phone] Pharmacy: [phone] 03/09/2014 2:51 PM

## 2014-03-10 ENCOUNTER — Inpatient Hospital Stay (HOSPITAL_COMMUNITY): Payer: Medicare Other

## 2014-03-10 LAB — COMPREHENSIVE METABOLIC PANEL
ALT: 17 U/L (ref 0–35)
AST: 16 U/L (ref 0–37)
Albumin: 2.8 g/dL — ABNORMAL LOW (ref 3.5–5.2)
Alkaline Phosphatase: 122 U/L — ABNORMAL HIGH (ref 39–117)
BILIRUBIN TOTAL: 0.4 mg/dL (ref 0.3–1.2)
BUN: 14 mg/dL (ref 6–23)
CO2: 25 mEq/L (ref 19–32)
CREATININE: 0.49 mg/dL — AB (ref 0.50–1.10)
Calcium: 8.3 mg/dL — ABNORMAL LOW (ref 8.4–10.5)
Chloride: 104 mEq/L (ref 96–112)
GFR calc non Af Amer: 83 mL/min — ABNORMAL LOW (ref >90)
GLUCOSE: 94 mg/dL (ref 70–99)
Potassium: 4 mEq/L (ref 3.7–5.3)
Sodium: 141 mEq/L (ref 137–147)
TOTAL PROTEIN: 5.7 g/dL — AB (ref 6.0–8.3)

## 2014-03-10 LAB — CBC
HEMATOCRIT: 30 % — AB (ref 36.0–46.0)
Hemoglobin: 9.7 g/dL — ABNORMAL LOW (ref 12.0–15.0)
MCH: 30.8 pg (ref 26.0–34.0)
MCHC: 32.3 g/dL (ref 30.0–36.0)
MCV: 95.2 fL (ref 78.0–100.0)
PLATELETS: 470 10*3/uL — AB (ref 150–400)
RBC: 3.15 MIL/uL — AB (ref 3.87–5.11)
RDW: 17 % — ABNORMAL HIGH (ref 11.5–15.5)
WBC: 5.3 10*3/uL (ref 4.0–10.5)

## 2014-03-10 NOTE — Evaluation (Signed)
Physical Therapy Evaluation Patient Details Name: [name] MRN: [id number] DOB: 18-Jun-1920 Today's Date: 03/10/2014   History of Present Illness  The patient is a 81 year old Caucasian woman who was in her usual state of good health until 14 days ago when she had a fall and landed on the left hip. This resulted in immediate pain and she was transported to an emergency room in the [facility] area for evaluation. She was found to have a left hip fracture and underwent an uneventful open reduction with internal fixation. She then had a brief inpatient rehabilitation in the hospital and returned yesterday to her home where she lives with her daughter and son-in-law. Since returned she has noted that her left leg edema and erythema has increased along with tenderness, and she's had difficulty ambulating in the home, despite use of walker, because of left hip pain. She has not had fever, chills, shortness of breath, chest pain, palpitations, or diarrhea. She's had mild constipation  Clinical Impression  Patient demonstrates deficits in mobility as indicated below. Will need continued skilled PT to address deficits and maximize function. Will see as indicated and progress as tolerated.    Follow Up Recommendations SNF    Equipment Recommendations  None recommended by PT    Recommendations for Other Services       Precautions / Restrictions Precautions Precautions: Fall Restrictions Weight Bearing Restrictions: No      Mobility  Bed Mobility                  Transfers Overall transfer level: Needs assistance Equipment used: Rolling walker (2 wheeled) Transfers: Sit to/from Stand Sit to Stand: Min guard         General transfer comment: Increased time to perform  Ambulation/Gait Ambulation/Gait assistance: Min guard Ambulation Distance (Feet): 24 Feet Assistive device: Rolling walker (2 wheeled) Gait Pattern/deviations: Step-to pattern Gait velocity:  decreased Gait velocity interpretation: <1.8 ft/sec, indicative of risk for recurrent falls General Gait Details: barely placing weight through LLE secondary to pain, hop to method limited mobility  Stairs            Wheelchair Mobility    Modified Rankin (Stroke Patients Only)       Balance                                             Pertinent Vitals/Pain 8/10 LLE pain    Home Living Family/patient expects to be discharged to:: Private residence Living Arrangements: Alone Available Help at Discharge: Family Type of Home: House Home Access: Stairs to enter;Ramped entrance Entrance Stairs-Rails: Right Entrance Stairs-Number of Steps: 5 Home Layout: One level Home Equipment: Walker - 2 wheels;Bedside commode Additional Comments: walk in shower with threshold    Prior Function Level of Independence: Independent         Comments: prior to fall     Hand Dominance   Dominant Hand: Right    Extremity/Trunk Assessment   Upper Extremity Assessment: LUE deficits/detail       LUE Deficits / Details: limited shoulder mobility   Lower Extremity Assessment: Generalized weakness;LLE deficits/detail         Communication   Communication: No difficulties  Cognition Arousal/Alertness: Awake/alert Behavior During Therapy: WFL for tasks assessed/performed Overall Cognitive Status: Within Functional Limits for tasks assessed  General Comments      Exercises        Assessment/Plan    PT Assessment Patient needs continued PT services  PT Diagnosis Difficulty walking;Abnormality of gait;Generalized weakness;Acute pain   PT Problem List Decreased strength;Decreased activity tolerance;Decreased balance;Decreased mobility;Pain  PT Treatment Interventions DME instruction;Gait training;Stair training;Functional mobility training;Therapeutic activities;Therapeutic exercise;Balance training;Patient/family education    PT Goals (Current goals can be found in the Care Plan section) Acute Rehab PT Goals Patient Stated Goal: to be independent again PT Goal Formulation: With patient/family Time For Goal Achievement: 03/24/14 Potential to Achieve Goals: Good    Frequency Min 3X/week   Barriers to discharge        Co-evaluation               End of Session Equipment Utilized During Treatment: Gait belt Activity Tolerance: Patient tolerated treatment well;Patient limited by pain Patient left: in chair;with call bell/phone within reach;with family/visitor present Nurse Communication: Mobility status         Time: 1044-1101 PT Time Calculation (min): 17 min   Charges:   PT Evaluation $Initial PT Evaluation Tier I: 1 Procedure PT Treatments $Gait Training: 8-22 mins   PT G Codes:          Duncan Dull 03/10/2014, 11:20 AM Alben Deeds, PT DPT  [phone]

## 2014-03-10 NOTE — Evaluation (Addendum)
Occupational Therapy Evaluation Patient Details Name: [name] MRN: [id number] DOB: 05-03-1920 Today's Date: 03/10/2014    History of Present Illness The patient is a 80 year old Caucasian woman who was in her usual state of good health until 14 days ago when she had a fall and landed on the left hip. This resulted in immediate pain and she was transported to an emergency room in the [facility] area for evaluation. She was found to have a left hip fracture and underwent an uneventful open reduction with internal fixation. She then had a brief inpatient rehabilitation in the hospital and returned yesterday to her home where she lives with her daughter and son-in-law. Since returned she has noted that her left leg edema and erythema has increased along with tenderness, and she's had difficulty ambulating in the home, despite use of walker, because of left hip pain. She has not had fever, chills, shortness of breath, chest pain, palpitations, or diarrhea. She's had mild constipation   Clinical Impression   Pt presents with below problem list. Pt independent with ADLs, prior to fall. Feel pt will benefit from acute OT to increase independence prior ot d/c.     Follow Up Recommendations  SNF    Equipment Recommendations  None recommended by OT    Recommendations for Other Services       Precautions / Restrictions Precautions Precautions: Fall Restrictions Weight Bearing Restrictions: No      Mobility Bed Mobility Overal bed mobility: Needs Assistance Bed Mobility: Supine to Sit     Supine to sit: Supervision        Transfers Overall transfer level: Needs assistance Equipment used: Rolling walker (2 wheeled) Transfers: Sit to/from Stand Sit to Stand: Min guard         General transfer comment: cues to reinforce technique.    Balance                                            ADL Overall ADL's : Needs assistance/impaired     Grooming:  Wash/dry hands;Standing;Set up;Supervision/safety           Upper Body Dressing : Set up;Supervision/safety;Sitting   Lower Body Dressing: Minimal assistance;With adaptive equipment;Sit to/from stand   Toilet Transfer: Min guard;Ambulation;RW (3 in 1 over commode)   Toileting- Clothing Manipulation and Hygiene: Min guard;Sit to/from stand       Functional mobility during ADLs: Min guard;Rolling walker General ADL Comments: Educated on use of bag on walker to carry items. Reviewed dressing technique and use of button up shirts (which pt says she mostly uses). Recommended sitting for dressing and to stand in front of chair/bed with walker in front when pulling up LB clothing. Pt practiced with AE for LB dressing. Ambulated some in hallway.     Vision                     Perception     Praxis      Pertinent Vitals/Pain Pain 2-3/10. Repositioned-placed LLE on pillow to elevate.      Hand Dominance Right   Extremity/Trunk Assessment Upper Extremity Assessment Upper Extremity Assessment: LUE deficits/detail LUE Deficits / Details: limited shoulder mobility; previous surgery   Lower Extremity Assessment Lower Extremity Assessment: Defer to PT evaluation       Communication Communication Communication: No difficulties   Cognition Arousal/Alertness: Awake/alert Behavior During  Therapy: WFL for tasks assessed/performed Overall Cognitive Status: Within Functional Limits for tasks assessed                     General Comments       Exercises       Shoulder Instructions      Home Living Family/patient expects to be discharged to:: Private residence Living Arrangements: Alone Available Help at Discharge: Family Type of Home: House Home Access: Stairs to enter;Ramped entrance Entrance Stairs-Number of Steps: 5 Entrance Stairs-Rails: Right Home Layout: One level     Bathroom Shower/Tub: Tub/shower unit;Walk-in shower   Bathroom Toilet:  Standard Bathroom Accessibility: Yes How Accessible: Accessible via walker (sideways) Home Equipment: Walker - 2 wheels;Bedside commode;Tub bench;Adaptive equipment Adaptive Equipment: Reacher;Sock aid;Long-handled shoe horn;Long-handled sponge        Prior Functioning/Environment Level of Independence: Independent             OT Diagnosis: Acute pain;Generalized weakness   OT Problem List: Decreased strength;Pain;Decreased knowledge of precautions;Decreased range of motion;Decreased activity tolerance   OT Treatment/Interventions: Self-care/ADL training;DME and/or AE instruction;Therapeutic activities;Patient/family education;Balance training    OT Goals(Current goals can be found in the care plan section) Acute Rehab OT Goals Patient Stated Goal: to be independent again OT Goal Formulation: With patient Time For Goal Achievement: 03/17/14 Potential to Achieve Goals: Good ADL Goals Pt Will Perform Lower Body Dressing: with modified independence;with adaptive equipment;sit to/from stand Pt Will Transfer to Toilet: with modified independence;ambulating (3 in 1 over commode) Pt Will Perform Toileting - Clothing Manipulation and hygiene: with modified independence;sit to/from stand  OT Frequency: Min 2X/week   Barriers to D/C:            Co-evaluation              End of Session Equipment Utilized During Treatment: Gait belt;Rolling walker Nurse Communication: Other (comment) (saw pt walking)  Activity Tolerance: Patient limited by fatigue Patient left: in chair;with call bell/phone within reach   Time: 1721-1745 OT Time Calculation (min): 24 min Charges:  OT General Charges $OT Visit: 1 Procedure OT Evaluation $Initial OT Evaluation Tier I: 1 Procedure OT Treatments $Self Care/Home Management : 8-22 mins G-Codes:    Benito Mccreedy OTR/L [phone] 03/10/2014, 6:06 PM

## 2014-03-10 NOTE — Progress Notes (Signed)
Subjective: [name] is seen today on rounds sitting up in a chair grooming herself. She does continue to complain about pain involving his left hip and left foot. She maintains that she never did well post surgery and leg does remain swollen. Venous Dopplers are noted and are negative.  Objective: Vital signs in last 24 hours: Temp:  [97.3 F (36.3 C)-98.5 F (36.9 C)] 97.3 F (36.3 C) (04/20 0531) Pulse Rate:  [69-83] 81 (04/20 0531) Resp:  [16-18] 16 (04/20 0531) BP: (119-140)/(53-109) 119/61 mmHg (04/20 0531) SpO2:  [97 %-100 %] 97 % (04/20 0531) Weight:  [53.9 kg (118 lb 13.3 oz)-55.339 kg (122 lb)] 55.339 kg (122 lb) (04/19 1517) Weight change:   CBG (last 3)  No results found for this basename: GLUCAP,  in the last 72 hours  Intake/Output from previous day: 04/19 0701 - 04/20 0700 In: 1817.8 [P.O.:1690; I.V.:77.8; IV Piggyback:50] Out: 1500 [Urine:1500]  Physical Exam: Patient is bright alert conversant sitting up in a chair with good facial symmetry Neck no JVD or bruits Lungs are clear Cardiovascular exam regular rate and rhythm 2/6 systolic ejection murmur Abdomen soft and nontender Extremities right no edema intact pulses left hip area with 2 stapled incision sites some mild erythema and distally extensive ecchymoses and erythema over the lateral aspect of the foot. Intact distal pulses. Entire legs 1-2+ edema. Neurologically she is nonlateralizing higher cortical functioning intact   Lab Results:  Recent Labs  03/09/14 1108 03/10/14 0400  NA 141 141  K 4.0 4.0  CL 104 104  CO2 26 25  GLUCOSE 98 94  BUN 13 14  CREATININE 0.44* 0.49*  CALCIUM 8.9 8.3*    Recent Labs  03/10/14 0400  AST 16  ALT 17  ALKPHOS 122*  BILITOT 0.4  PROT 5.7*  ALBUMIN 2.8*    Recent Labs  03/09/14 1108 03/10/14 0400  WBC 5.6 5.3  HGB 10.7* 9.7*  HCT 32.9* 30.0*  MCV 94.0 95.2  PLT 362 470*   No results found for this basename: INR, PROTIME   No results found for  this basename: CKTOTAL, CKMB, CKMBINDEX, TROPONINI,  in the last 72 hours No results found for this basename: TSH, T4TOTAL, FREET3, T3FREE, THYROIDAB,  in the last 72 hours No results found for this basename: VITAMINB12, FOLATE, FERRITIN, TIBC, IRON, RETICCTPCT,  in the last 72 hours  Studies/Results: No results found.   Assessment/Plan: #1 subacute left hip fracture status post open reduction internal fixation continued pain  #2 cellulitis on clindamycin  #3 left lower extremity edema and pain may be all post traumatic and post hip repair will repeat films to be certain no fracture has been missed and be certain that left hip repair is still with good integrity  #4 postop anemia  Will consult her primary orthopedist here locally to reevaluate whether staples can come out and integrity of the repair. OT and PT consult. Continue antibiotics and target skilled nursing placement   LOS: 1 day   Geoffery Lyons 03/10/2014, 8:26 AM

## 2014-03-10 NOTE — Progress Notes (Signed)
PT Cancellation Note  Patient Details Name: BRIANCA FORTENBERRY MRN: [id number] DOB: 26-Jan-1920   Cancelled Treatment:    Reason Eval/Treat Not Completed: Patient at procedure or test/unavailable, Diagnostic Rad.    Duncan Dull 03/10/2014, 9:13 AM Alben Deeds, PT DPT  [phone]

## 2014-03-11 MED ORDER — HYDROCODONE-ACETAMINOPHEN 5-325 MG PO TABS: 1.0000 | ORAL_TABLET | Freq: Four times a day (QID) | ORAL | Status: DC | PRN

## 2014-03-11 MED ORDER — CLINDAMYCIN HCL 300 MG PO CAPS: 300.0000 mg | ORAL_CAPSULE | Freq: Three times a day (TID) | ORAL | Status: DC

## 2014-03-11 MED ORDER — POLYSACCHARIDE IRON COMPLEX 150 MG PO CAPS: 150.0000 mg | ORAL_CAPSULE | Freq: Every day | ORAL | Status: DC

## 2014-03-11 NOTE — Consult Note (Signed)
Melrose Nakayama, MD  Chauncey Cruel, PA-C  Loni Dolly, PA-C                                  [facility]                [address] [place], [place]  [address]   [place]            MRN:  [id number] DOB/SEX:  [birth year]/female     CHIEF COMPLAINT:  Painful left hip  HISTORY: [name] a 80 y.o. female [place].  No LOC and remembers fall well.  [facility] and admitted here for cellulitis by Dr Salina April. Active with PT TDWB.  ORS consulted for advice about hip.   PAST MEDICAL HISTORY: Patient Active Problem List   Diagnosis Date Noted  . Cellulitis 03/09/2014    Class: Acute  . Fracture of femur, midcervical 03/09/2014    Class: Acute  . Gait instability 03/09/2014    Class: Acute  . Anemia 03/09/2014    Class: Acute  . Cancer   . Breast cancer   . Uterine cancer   . Arthritis    Past Medical History  Diagnosis Date  . Cancer      Adeno CA of uterus I  . Cancer     Breast cancer X 2  . Breast cancer   . Uterine cancer   . Arthritis   . History of shingles 02/16/12   Past Surgical History  Procedure Laterality Date  . Appendectomy    . Abdominal hysterectomy      TAH BSO  . Shoulder surgery    . Cataract extraction    . Hemorrhoid surgery    . Breast surgery      Lumpectomy right and left breast-Radiation and Tamoxfen  . Oophorectomy      BSO     MEDICATIONS:  Current facility-administered medications:0.9 % NaCl with KCl 20 mEq/ L  infusion, , Intravenous, Continuous, Leanna Battles, MD, Last Rate: 10 mL/hr at 03/09/14 1643;  alum & mag hydroxide-simeth (MAALOX/MYLANTA) 200-200-20 MG/5ML suspension 30 mL, 30 mL, Oral, Q6H PRN, Leanna Battles, MD;  aspirin chewable tablet 81 mg, 81 mg, Oral, Daily, Leanna Battles, MD, 81 mg at 03/10/14 1038 bisacodyl (DULCOLAX) suppository 10 mg, 10 mg, Rectal, Daily PRN, Leanna Battles, MD;  calcium-vitamin D (OSCAL WITH D) 500-200 MG-UNIT per tablet 1 tablet, 1 tablet, Oral, Q breakfast, Geoffery Lyons, MD, 1 tablet at 03/11/14 0800;  cholecalciferol (VITAMIN D) tablet 400 Units, 400 Units, Oral, Daily, Leanna Battles, MD, 400 Units at 03/10/14 1041 clindamycin (CLEOCIN) IVPB 600 mg, 600 mg, Intravenous, 3 times per day, Leanna Battles, MD, 600 mg at 03/11/14 0650;  enoxaparin (LOVENOX) injection 40 mg, 40 mg, Subcutaneous, Q24H, Leanna Battles, MD, 40 mg at 03/10/14 1646;  HYDROcodone-acetaminophen (NORCO/VICODIN) 5-325 MG per tablet 1 tablet, 1 tablet, Oral, Q6H PRN, Leanna Battles, MD, 1 tablet at 03/10/14 2332 iron polysaccharides (NIFEREX) capsule 150 mg, 150 mg, Oral, Daily, Leanna Battles, MD, 150 mg at 03/10/14 1038;  multivitamin with minerals tablet 1 tablet, 1 tablet, Oral, Daily, Leanna Battles, MD, 1 tablet at 03/10/14 1038;  promethazine (PHENERGAN) tablet 12.5 mg, 12.5 mg, Oral, Q6H PRN, Leanna Battles, MD;  senna-docusate (Senokot-S) tablet 1 tablet, 1 tablet, Oral, QHS PRN, Quillian Quince  Philip Aspen, MD vitamin E capsule 400 Units, 400 Units, Oral, Daily, Leanna Battles, MD, 400 Units at 03/10/14 1038;  zolpidem (AMBIEN) tablet 5 mg, 5 mg, Oral, QHS PRN, Leanna Battles, MD, 5 mg at 03/10/14 2104  ALLERGIES:   Allergies  Allergen Reactions  . Penicillins Itching and Swelling    REVIEW OF SYSTEMS: REVIEWED IN DETAIL IN CHART  FAMILY HISTORY:   Family History  Problem Relation Age of Onset  . Hypertension Mother   . Heart disease Mother   . Hypertension Father   . Heart disease Father   . Breast cancer Sister     age 54  . Hypertension Sister   . Diabetes Brother   . Hypertension Brother   . Heart disease Brother     SOCIAL HISTORY:   History  Substance Use Topics  . Smoking status: Never Smoker   . Smokeless tobacco: Not on file  . Alcohol Use: 3.5 oz/week    7 drink(s) per week     EXAMINATION: Vital signs in last 24 hours: Temp:   [97.2 F (36.2 C)-98.1 F (36.7 C)] 97.2 F (36.2 C) (04/21 0758) Pulse Rate:  [76-82] 76 (04/21 0758) Resp:  [16-18] 16 (04/21 0758) BP: (110-156)/(41-53) 154/46 mmHg (04/21 0758) SpO2:  [97 %-99 %] 99 % (04/21 0758)  BP 154/46  Pulse 76  Temp(Src) 97.2 F (36.2 C) (Oral)  Resp 16  Ht 4\' 11"  (1.499 m)  Wt 55.339 kg (122 lb)  BMI 24.63 kg/m2  SpO2 99%  General Appearance:    Alert, cooperative, no distress, appears stated age  Head:    Normocephalic, without obvious abnormality, atraumatic  Eyes:    PERRL, conjunctiva/corneas clear, EOM's intact, fundi    benign, both eyes  Ears:    Normal TM's and external ear canals, both ears  Nose:   Nares normal, septum midline, mucosa normal, no drainage    or sinus tenderness  Throat:   Lips, mucosa, and tongue normal; teeth and gums normal  Neck:   Supple, symmetrical, trachea midline, no adenopathy;    thyroid:  no enlargement/tenderness/nodules; no carotid   bruit or JVD  Back:     Symmetric, no curvature, ROM normal, no CVA tenderness  Lungs:     Clear to auscultation bilaterally, respirations unlabored  Chest Wall:    No tenderness or deformity   Heart:    Regular rate and rhythm, S1 and S2 normal, no murmur, rub   or gallop  Breast Exam:    No tenderness, masses, or nipple abnormality  Abdomen:     Soft, non-tender, bowel sounds active all four quadrants,    no masses, no organomegaly  Genitalia:    Normal female without lesion, discharge or tenderness  Rectal:    Normal tone, normal prostate, no masses or tenderness;   guaiac negative stool  Extremities:   Extremities normal, atraumatic, no cyanosis or edema  Pulses:   2+ and symmetric all extremities  Skin:   Skin color, texture, turgor normal, no rashes or lesions  Lymph nodes:   Cervical, supraclavicular, and axillary nodes normal  Neurologic:   CNII-XII intact, normal strength, sensation and reflexes    throughout    Musculoskeletal Exam  :left hip decent ROM.  Staples  benign at all three incisions.  LL equal.  Left foot moderately swollen and red consistent with cellulitis.  No palp LAD   DIAGNOSTIC STUDIES: Recent laboratory studies:  Recent Labs  03/09/14 1108 03/10/14 0400  WBC 5.6 5.3  HGB 10.7* 9.7*  HCT 32.9* 30.0*  PLT 362 470*    Recent Labs  03/09/14 1108 2014/03/13 0400  NA 141 141  K 4.0 4.0  CL 104 104  CO2 26 25  BUN 13 14  CREATININE 0.44* 0.49*  GLUCOSE 98 94  CALCIUM 8.9 8.3*   No results found for this basename: INR, PROTIME     Recent Radiographic Studies :  Dg Hip Complete Left  2014-03-13   CLINICAL DATA:  Fall  EXAM: LEFT HIP - COMPLETE 2+ VIEW  COMPARISON:  None.  FINDINGS: There is a comminuted left intertrochanteric fracture transfixed with a left femoral intra medullary nail with an interlocking cannulated femoral neck screw without hardware failure or complication. The alignment is near anatomic there is no dislocation. There are postsurgical changes in the surrounding soft tissues.  There is no other fracture.  There is generalized osteopenia.  IMPRESSION: ORIF left intertrochanteric fracture.   Electronically Signed   By: Kathreen Devoid   On: March 13, 2014 09:50   Dg Ankle 2 Views Left  03-13-2014   CLINICAL DATA:  Fall.  EXAM: LEFT ANKLE - 2 VIEW  COMPARISON:  None.  FINDINGS: Diffuse soft tissue swelling is present. No evidence of fracture or dislocation. Degenerative change present.No acute bony abnormality.  IMPRESSION: 1. Diffuse soft tissue swelling. 2. Degenerative change left ankle.   Electronically Signed   By: Marcello Moores  Register   On: 03-13-2014 10:05   Dg Foot 2 Views Left  03-13-14   CLINICAL DATA:  Fall.  EXAM: LEFT FOOT - 2 VIEW  COMPARISON:  None.  FINDINGS: Diffuse soft tissue swelling is present. Diffuse osteopenia and degenerative change. No evidence of fracture. Old Left second metatarsal fracture is present.  IMPRESSION: 1. Diffuse soft tissue swelling. 2. Old left second metatarsal fracture.    Electronically Signed   By: Marcello Moores  Register   On: Mar 13, 2014 10:04    ASSESSMENT:  S/P left hip IMHS     PLAN:  Staples removed.  New bandages applied and just need them for two days at which point she need nothing at all on the wounds and may get wet in shower.  Can be TDWB to [facility] with PT.  Needs follow up with me in two to three weeks for new Xray of hip and gradual advance in WB status.  Agree with antibiotics for cellulitis.  Please call if there are any questions about this nice lady I have enjoyed knowing for many years.  [phone] is my cell number.  Hessie Dibble 03/11/2014, 10:16 AM

## 2014-03-11 NOTE — Progress Notes (Signed)
Pt to be d/c today to [facility].   Pt and family agreeable. Confirmed plans with facility.  Plan transfer via EMS.    [facility]  [address];  [phone] Phone: [phone]

## 2014-03-11 NOTE — Progress Notes (Signed)
Received a called from [facility] and they have a bed available for the patient. Patient is welcome to come at 1p.m. Per [employer].  [place] BSW-Intern [facility] [phone]

## 2014-03-11 NOTE — Progress Notes (Signed)
Report given to Erlene Quan, LPN at [facility]. All questions answered. Erlene Quan reported that he is ready for pt and will call if there are any further questions.

## 2014-03-11 NOTE — Discharge Summary (Signed)
DISCHARGE SUMMARY  [name]  MR#: [id number]  DOB:1920/04/23  Date of Admission: 03/09/2014 Date of Discharge: 03/11/2014  Attending Physician:Rebbie Lauricella A Jacky Kindle  Patient's DUK:GURKYHC,WCBJSEG A, MD  Consults:  ortho  Discharge Diagnoses: Active Problems:   Cellulitis   Fracture of femur, midcervical   Gait instability   Anemia   Subacute fracture left foot   Anemia of chronic disease   Benign edema reactive left lower strandy no DVT  Discharge Medications:   Medication List    STOP taking these medications       Cranberry 475 MG Caps     IRON PO     MELATONIN PO     VITAMIN D PO     vitamin E 400 UNIT capsule  Generic drug:  vitamin E      TAKE these medications       aspirin 81 MG tablet  Take 81 mg by mouth daily.     CALCIUM 600+D 600-400 MG-UNIT per tablet  Generic drug:  Calcium Carbonate-Vitamin D  Take 1 tablet by mouth daily.     CENTRUM SILVER ULTRA WOMENS PO  Take 1 tablet by mouth daily.     clindamycin 300 MG capsule  Commonly known as:  CLEOCIN  Take 1 capsule (300 mg total) by mouth 3 (three) times daily.     HYDROcodone-acetaminophen 5-325 MG per tablet  Commonly known as:  NORCO/VICODIN  Take 1 tablet by mouth every 6 (six) hours as needed for moderate pain.     iron polysaccharides 150 MG capsule  Commonly known as:  NIFEREX  Take 1 capsule (150 mg total) by mouth daily.        Hospital Procedures: Dg Hip Complete Left  03/10/2014   CLINICAL DATA:  Fall  EXAM: LEFT HIP - COMPLETE 2+ VIEW  COMPARISON:  None.  FINDINGS: There is a comminuted left intertrochanteric fracture transfixed with a left femoral intra medullary nail with an interlocking cannulated femoral neck screw without hardware failure or complication. The alignment is near anatomic there is no dislocation. There are postsurgical changes in the surrounding soft tissues.  There is no other fracture.  There is generalized osteopenia.  IMPRESSION: ORIF left  intertrochanteric fracture.   Electronically Signed   By: Elige Ko   On: 03/10/2014 09:50   Dg Ankle 2 Views Left  03/10/2014   CLINICAL DATA:  Fall.  EXAM: LEFT ANKLE - 2 VIEW  COMPARISON:  None.  FINDINGS: Diffuse soft tissue swelling is present. No evidence of fracture or dislocation. Degenerative change present.No acute bony abnormality.  IMPRESSION: 1. Diffuse soft tissue swelling. 2. Degenerative change left ankle.   Electronically Signed   By: Maisie Fus  Register   On: 03/10/2014 10:05   Dg Foot 2 Views Left  03/10/2014   CLINICAL DATA:  Fall.  EXAM: LEFT FOOT - 2 VIEW  COMPARISON:  None.  FINDINGS: Diffuse soft tissue swelling is present. Diffuse osteopenia and degenerative change. No evidence of fracture. Old Left second metatarsal fracture is present.  IMPRESSION: 1. Diffuse soft tissue swelling. 2. Old left second metatarsal fracture.   Electronically Signed   By: Maisie Fus  Register   On: 03/10/2014 10:04    History of Present Illness: The patient is a 80 year old Caucasian woman who was in her usual state of good health until 14 days ago when she had a fall and landed on the left hip. This resulted in immediate pain and she was transported to an emergency room in the [facility]  [place] for evaluation. She was found to have a left hip fracture and underwent an uneventful open reduction with internal fixation. She then had a brief inpatient rehabilitation in the hospital and returned yesterday to her home where she lives with her daughter and son-in-law. Since returned she has noted that her left leg edema and erythema has increased along with tenderness, and she's had difficulty ambulating in the home, despite use of walker, because of left hip pain. She has not had fever, chills, shortness of breath, chest pain, palpitations, or diarrhea. She's had mild constipation   Hospital Course: Patient was admitted placed on IV Cleocin radiographic workup completed. Films of the left hip, left ankle  and left foot revealed healing fractures particularly involving the left hip with good approximation. Ankle was clear. Left foot revealed an old fracture and remains unclear as to whether this corresponds to the fall 2-3 weeks ago or something prior but appears to be healing. She also had a venous Doppler which demonstrated no evidence of DVT. She did have fairly extensive erythema involving his lateral thigh and left foot all of which is improved dramatically with IV Cleocin being changed to oral at this time. She does continue to need significant OT and PT and she was independent prior to this and is unable to function independently at this time. She's been stable from a cardiopulmonary standpoint. Neurologically and cognitively intact. Intake has been excellent. She is transferred to skilled nursing for continued rehabilitation.  Day of Discharge Exam BP 156/53  Pulse 81  Temp(Src) 98.1 F (36.7 C) (Oral)  Resp 16  Ht 4\' 11"  (1.499 m)  Wt 55.339 kg (122 lb)  BMI 24.63 kg/m2  SpO2 99%  Physical Exam: General appearance: alert, cooperative and no distress Eyes: no scleral icterus Throat: oropharynx moist without erythema Resp: clear to auscultation bilaterally Cardio: regular rate and rhythm Extremities: no clubbing, cyanosis, left lower extremity reveals 2 incisions with staples clean and diffuse edema involving entire leg. Intact distal pulses. Extensive ecchymoses from the fall all of which appears to be old. The fairly significant erythema involving the left lateral thigh and left foot have improved dramatically. Orthopedic surgery is supposed to be weighing in today regarding removal of staples. Neurologically she is nonlateralizing higher cortical functioning is intact.  Discharge Labs:  Recent Labs  03/09/14 1108 03/10/14 0400  NA 141 141  K 4.0 4.0  CL 104 104  CO2 26 25  GLUCOSE 98 94  BUN 13 14  CREATININE 0.44* 0.49*  CALCIUM 8.9 8.3*    Recent Labs  03/10/14 0400   AST 16  ALT 17  ALKPHOS 122*  BILITOT 0.4  PROT 5.7*  ALBUMIN 2.8*    Recent Labs  03/09/14 1108 03/10/14 0400  WBC 5.6 5.3  HGB 10.7* 9.7*  HCT 32.9* 30.0*  MCV 94.0 95.2  PLT 362 470*   No results found for this basename: CKTOTAL, CKMB, CKMBINDEX, TROPONINI,  in the last 72 hours No results found for this basename: TSH, T4TOTAL, FREET3, T3FREE, THYROIDAB,  in the last 72 hours No results found for this basename: VITAMINB12, FOLATE, FERRITIN, TIBC, IRON, RETICCTPCT,  in the last 72 hours  Discharge instructions:     Discharge Orders   Future Appointments Provider Department Dept Phone   03/28/2014 2:00 PM Ok Edwards, MD [facility] [phone]   Future Orders Complete By Expires   Diet - low sodium heart healthy  As directed    Increase activity  slowly  As directed       Disposition: Skilled nursing  Follow-up Appts: Follow-up with Dr. Jacky Kindle at [facility] in once discharged from skilled nursing in the interim she'll be seeing the doctor at the skilled nursing Center.  Call for appointment.  Condition on Discharge: Improved and stable condition  Tests Needing Follow-up: Staple removal per Dr. Freida Busman orthopedic surgery  Signed: Minda Meo 03/11/2014, 7:52 AM

## 2014-03-11 NOTE — Progress Notes (Signed)
CSW left a message for [facility] admissions coordinator for Pt d/c today.   CSW also spoke with the Pt and received permission to fax to [facility] area.   Pt stated she "prefers [facility] [employer] [facility] because it is convinient for the Pt/Family."  CSW will continue to follow Pt for d/c planning.    [facility]  [address];  [phone] Phone: [phone]

## 2014-03-15 LAB — CULTURE, BLOOD (ROUTINE X 2)
Culture: NO GROWTH
Culture: NO GROWTH

## 2014-03-28 ENCOUNTER — Encounter: Payer: Medicare Other | Admitting: Gynecology

## 2014-04-06 ENCOUNTER — Emergency Department (HOSPITAL_COMMUNITY)
Admission: EM | Admit: 2014-04-06 | Discharge: 2014-04-06 | Disposition: A | Discharge status: Home / Self Care | Payer: Medicare Other | Attending: Emergency Medicine | Admitting: Emergency Medicine

## 2014-04-06 ENCOUNTER — Encounter (HOSPITAL_COMMUNITY): Payer: Self-pay | Admitting: Emergency Medicine

## 2014-04-06 ENCOUNTER — Emergency Department (HOSPITAL_COMMUNITY): Payer: Medicare Other

## 2014-04-06 DIAGNOSIS — M25539 Pain in unspecified wrist: Secondary | ICD-10-CM

## 2014-04-06 DIAGNOSIS — Z8541 Personal history of malignant neoplasm of cervix uteri: Secondary | ICD-10-CM | POA: Insufficient documentation

## 2014-04-06 DIAGNOSIS — Z872 Personal history of diseases of the skin and subcutaneous tissue: Secondary | ICD-10-CM | POA: Insufficient documentation

## 2014-04-06 DIAGNOSIS — Z853 Personal history of malignant neoplasm of breast: Secondary | ICD-10-CM | POA: Insufficient documentation

## 2014-04-06 DIAGNOSIS — Z79899 Other long term (current) drug therapy: Secondary | ICD-10-CM | POA: Insufficient documentation

## 2014-04-06 DIAGNOSIS — Z880 Allergy status to penicillin: Secondary | ICD-10-CM | POA: Insufficient documentation

## 2014-04-06 DIAGNOSIS — Z7982 Long term (current) use of aspirin: Secondary | ICD-10-CM | POA: Insufficient documentation

## 2014-04-06 DIAGNOSIS — M129 Arthropathy, unspecified: Secondary | ICD-10-CM | POA: Insufficient documentation

## 2014-04-06 NOTE — ED Provider Notes (Signed)
CSN: [id number]     Arrival date & time 04/06/14  1711 History   First MD Initiated Contact with Patient 04/06/14 2045     Chief Complaint  Patient presents with  . Wrist Pain    Right wrist     (Consider location/radiation/quality/duration/timing/severity/associated sxs/prior Treatment) HPI Comments: Patient presents emergency department with chief complaint of right wrist pain. She states that she has had the pain for the past 2 weeks. She thinks that it is caused by using her walker. She was recently treated for hip fracture, and was told to ambulate with walker. She states the pain became more severe this morning. She denies any new falls or injuries to the wrist. She states the pain is worsened with movement. It is improved with rest. Denies any chest pain, shortness of breath, or abdominal pain.  The history is provided by the patient. No language interpreter was used.    Past Medical History  Diagnosis Date  . Cancer      Adeno CA of uterus I  . Cancer     Breast cancer X 2  . Breast cancer   . Uterine cancer   . Arthritis   . History of shingles 02/16/12   Past Surgical History  Procedure Laterality Date  . Appendectomy    . Abdominal hysterectomy      TAH BSO  . Shoulder surgery    . Cataract extraction    . Hemorrhoid surgery    . Breast surgery      Lumpectomy right and left breast-Radiation and Tamoxfen  . Oophorectomy      BSO   Family History  Problem Relation Age of Onset  . Hypertension Mother   . Heart disease Mother   . Hypertension Father   . Heart disease Father   . Breast cancer Sister     age 69  . Hypertension Sister   . Diabetes Brother   . Hypertension Brother   . Heart disease Brother    History  Substance Use Topics  . Smoking status: Never Smoker   . Smokeless tobacco: Not on file  . Alcohol Use: 3.5 oz/week    7 drink(s) per week   OB History   Grav Para Term Preterm Abortions TAB SAB Ect Mult Living   3 3 3       3       Review of Systems  Constitutional: Negative for fever and chills.  Respiratory: Negative for shortness of breath.   Cardiovascular: Negative for chest pain.  Gastrointestinal: Negative for nausea, vomiting, diarrhea and constipation.  Genitourinary: Negative for dysuria.  Musculoskeletal: Positive for arthralgias.      Allergies  Penicillins  Home Medications   Prior to Admission medications   Medication Sig Start Date End Date Taking? Authorizing Provider  aspirin 81 MG tablet Take 81 mg by mouth daily.    Historical Provider, MD  Calcium Carbonate-Vitamin D (CALCIUM 600+D) 600-400 MG-UNIT per tablet Take 1 tablet by mouth daily.    Historical Provider, MD  clindamycin (CLEOCIN) 300 MG capsule Take 1 capsule (300 mg total) by mouth 3 (three) times daily. 03/11/14   Geoffery Lyons, MD  HYDROcodone-acetaminophen (NORCO/VICODIN) 5-325 MG per tablet Take 1 tablet by mouth every 6 (six) hours as needed for moderate pain. 03/11/14   Geoffery Lyons, MD  iron polysaccharides (NIFEREX) 150 MG capsule Take 1 capsule (150 mg total) by mouth daily. 03/11/14   Geoffery Lyons, MD  Multiple Vitamins-Minerals (CENTRUM SILVER ULTRA  WOMENS PO) Take 1 tablet by mouth daily.    Historical Provider, MD   BP 108/69  Pulse 87  Temp(Src) 98.2 F (36.8 C) (Oral)  Resp 18  Ht 4\' 9"  (1.448 m)  Wt 120 lb (54.432 kg)  BMI 25.96 kg/m2  SpO2 96% Physical Exam  Nursing note and vitals reviewed. Constitutional: She is oriented to person, place, and time. She appears well-developed and well-nourished.  HENT:  Head: Normocephalic and atraumatic.  Eyes: Conjunctivae and EOM are normal. Pupils are equal, round, and reactive to light.  Neck: Normal range of motion. Neck supple.  Cardiovascular: Normal rate, regular rhythm and intact distal pulses.  Exam reveals no gallop and no friction rub.   No murmur heard. Intact distal pulses with brisk capillary refill  Pulmonary/Chest: Effort normal and  breath sounds normal. No respiratory distress. She has no wheezes. She has no rales. She exhibits no tenderness.  Abdominal: Soft. Bowel sounds are normal. She exhibits no distension and no mass. There is no tenderness. There is no rebound and no guarding.  Musculoskeletal: Normal range of motion. She exhibits no edema and no tenderness.  Right wrist tender to palpation, no bony abnormality or deformity, no swelling, range of motion and strength reduced secondary to pain  Neurological: She is alert and oriented to person, place, and time.  Sensation intact  Skin: Skin is warm and dry.  No erythema, no evidence of infection on wrist or lower extremities  Psychiatric: She has a normal mood and affect. Her behavior is normal. Judgment and thought content normal.    ED Course  Procedures (including critical care time) Labs Review Labs Reviewed - No data to display  Imaging Review Dg Wrist Complete Right  04/06/2014   CLINICAL DATA:  WRIST PAIN  EXAM: RIGHT WRIST - COMPLETE 3+ VIEW  COMPARISON:  None.  FINDINGS: There is no evidence of fracture or dislocation. Degenerative changes are appreciated within the metacarpal carpal joints, most pronounced within the first carpometacarpal joint. Degenerative changes also appreciated in the intercarpal joints and radiocarpal joint. Soft tissues are unremarkable.  IMPRESSION: Osteoarthritic changes within the wrist without acute osseous abnormalities.   Electronically Signed   By: Margaree Mackintosh M.D.   On: 04/06/2014 20:37     EKG Interpretation None      MDM   Final diagnoses:  Wrist pain    Patient with right wrist pain x2 weeks. Recently worsened today. She ambulates with walker, and believes the walker is the source of her pain.  Patient seen by and discussed with Dr. Wyvonnia Dusky.  Will discharge to home with a prescription for a rolator.  Recommend PCP follow-up.   Montine Circle, PA-C 04/06/14 2132  Montine Circle, PA-C 04/07/14 2202

## 2014-04-06 NOTE — ED Notes (Signed)
Pt c/o right wrist pain ongoing for 2 weeks. Pt denies injury reports that she thinks it could be from using her walker. Pt seen by PMD for same and had x-rays done.

## 2014-04-06 NOTE — Discharge Instructions (Signed)
Osteoarthritis Osteoarthritis is a disease that causes soreness and swelling (inflammation) of a joint. It occurs when the cartilage at the affected joint wears down. Cartilage acts as a cushion, covering the ends of bones where they meet to form a joint. Osteoarthritis is the most common form of arthritis. It often occurs in older people. The joints affected most often by this condition include those in the:  Ends of the fingers.  Thumbs.  Neck.  Lower back.  Knees.  Hips. CAUSES  Over time, the cartilage that covers the ends of bones begins to wear away. This causes bone to rub on bone, producing pain and stiffness in the affected joints.  RISK FACTORS Certain factors can increase your chances of having osteoarthritis, including:  Older age.  Excessive body weight.  Overuse of joints. SIGNS AND SYMPTOMS   Pain, swelling, and stiffness in the joint.  Over time, the joint may lose its normal shape.  Small deposits of bone (osteophytes) may grow on the edges of the joint.  Bits of bone or cartilage can break off and float inside the joint space. This may cause more pain and damage. DIAGNOSIS  Your health care provider will do a physical exam and ask about your symptoms. Various tests may be ordered, such as:  X-rays of the affected joint.  An MRI scan.  Blood tests to rule out other types of arthritis.  Joint fluid tests. This involves using a needle to draw fluid from the joint and examining the fluid under a microscope. TREATMENT  Goals of treatment are to control pain and improve joint function. Treatment plans may include:  A prescribed exercise program that allows for rest and joint relief.  A weight control plan.  Pain relief techniques, such as:  Properly applied heat and cold.  Electric pulses delivered to nerve endings under the skin (transcutaneous electrical nerve stimulation, TENS).  Massage.  Certain nutritional supplements.  Medicines to  control pain, such as:  Acetaminophen.  Nonsteroidal anti-inflammatory drugs (NSAIDs), such as naproxen.  Narcotic or central-acting agents, such as tramadol.  Corticosteroids. These can be given orally or as an injection.  Surgery to reposition the bones and relieve pain (osteotomy) or to remove loose pieces of bone and cartilage. Joint replacement may be needed in advanced states of osteoarthritis. HOME CARE INSTRUCTIONS   Only take over-the-counter or prescription medicines as directed by your health care provider. Take all medicines exactly as instructed.  Maintain a healthy weight. Follow your health care provider's instructions for weight control. This may include dietary instructions.  Exercise as directed. Your health care provider can recommend specific types of exercise. These may include:  Strengthening exercises These are done to strengthen the muscles that support joints affected by arthritis. They can be performed with weights or with exercise bands to add resistance.  Aerobic activities These are exercises, such as brisk walking or low-impact aerobics, that get your heart pumping.  Range-of-motion activities These keep your joints limber.  Balance and agility exercises These help you maintain daily living skills.  Rest your affected joints as directed by your health care provider.  Follow up with your health care provider as directed. SEEK MEDICAL CARE IF:   Your skin turns red.  You develop a rash in addition to your joint pain.  You have worsening joint pain. SEEK IMMEDIATE MEDICAL CARE IF:  You have a significant loss of weight or appetite.  You have a fever along with joint or muscle aches.  You have   night sweats. FOR MORE INFORMATION  National Institute of Arthritis and Musculoskeletal and Skin Diseases: www.niams.nih.gov National Institute on Aging: www.nia.nih.gov American College of Rheumatology: www.rheumatology.org Document Released: 11/07/2005  Document Revised: 08/28/2013 Document Reviewed: 07/15/2013 Exit[employer] Patient Information 2014 [employer].  

## 2014-04-07 NOTE — ED Provider Notes (Signed)
Medical screening examination/treatment/procedure(s) were conducted as a shared visit with non-physician practitioner(s) and myself.  I personally evaluated the patient during the encounter.  Atraumatic R wrist pain x 2 weeks after using walker.  No other joint pain. No redness, swelling.  TTP R dorsal wrist with reduced ROM. +2 radial pulse, capillary refill intact. Cardinal hand movements intact. No effusion or erythema.   EKG Interpretation None       Ezequiel Essex, MD 04/07/14 1049

## 2014-09-22 ENCOUNTER — Encounter (HOSPITAL_COMMUNITY): Payer: Self-pay | Admitting: Emergency Medicine

## 2014-10-10 ENCOUNTER — Other Ambulatory Visit (INDEPENDENT_AMBULATORY_CARE_PROVIDER_SITE_OTHER): Payer: Self-pay | Admitting: General Surgery

## 2014-10-10 DIAGNOSIS — N6099 Unspecified benign mammary dysplasia of unspecified breast: Secondary | ICD-10-CM

## 2014-10-14 ENCOUNTER — Other Ambulatory Visit (HOSPITAL_COMMUNITY): Payer: Self-pay | Admitting: Oncology

## 2014-10-14 DIAGNOSIS — C50212 Malignant neoplasm of upper-inner quadrant of left female breast: Secondary | ICD-10-CM

## 2014-10-15 ENCOUNTER — Telehealth (INDEPENDENT_AMBULATORY_CARE_PROVIDER_SITE_OTHER): Payer: Self-pay

## 2014-10-15 ENCOUNTER — Other Ambulatory Visit (INDEPENDENT_AMBULATORY_CARE_PROVIDER_SITE_OTHER): Payer: Self-pay | Admitting: General Surgery

## 2014-10-15 DIAGNOSIS — Z853 Personal history of malignant neoplasm of breast: Secondary | ICD-10-CM

## 2014-10-15 DIAGNOSIS — L989 Disorder of the skin and subcutaneous tissue, unspecified: Secondary | ICD-10-CM

## 2014-10-15 DIAGNOSIS — C50919 Malignant neoplasm of unspecified site of unspecified female breast: Secondary | ICD-10-CM

## 2014-10-15 HISTORY — DX: Malignant neoplasm of unspecified site of unspecified female breast: C50.919

## 2014-10-15 NOTE — Telephone Encounter (Signed)
Pt seen in office by Dr Dalbert Batman and punch bx done on rt breast by Dr Dalbert Batman.

## 2014-10-20 ENCOUNTER — Telehealth (INDEPENDENT_AMBULATORY_CARE_PROVIDER_SITE_OTHER): Payer: Self-pay | Admitting: General Surgery

## 2014-10-20 NOTE — Telephone Encounter (Signed)
Verbal report from the punch biopsy of the skin of her breast shows findings consistent with metastatic breast cancer. Breast diagnostic profile is pending.  I discussed this with Dr. Claudette Laws. I called the patient.  She told me that she was almost deaf and she requested that I discussed this with her daughter Hilda Blades.  I advised him of the pathology report. She has an appointment for a PET scan on December 8.  She has an appointment with Dr. Abran Duke shortly thereafter.  She has an appointment with me shortly thereafter to get the sutures out. They are aware that we will need to decide whether she is to be treated primarily with antiestrogen therapy or whether  she will need an operation.   Edsel Petrin. Dalbert Batman, M.D., [facility] General and Minimally invasive Surgery Breast and Colorectal Surgery Office:   [phone] Pager:   [phone]

## 2014-10-28 ENCOUNTER — Ambulatory Visit (HOSPITAL_COMMUNITY)
Admission: RE | Admit: 2014-10-28 | Discharge: 2014-10-28 | Disposition: A | Discharge status: Home / Self Care | Payer: Medicare Other | Source: Ambulatory Visit | Attending: Oncology | Admitting: Oncology

## 2014-10-28 DIAGNOSIS — Z853 Personal history of malignant neoplasm of breast: Secondary | ICD-10-CM | POA: Diagnosis not present

## 2014-10-28 DIAGNOSIS — Z90710 Acquired absence of both cervix and uterus: Secondary | ICD-10-CM | POA: Diagnosis not present

## 2014-10-28 DIAGNOSIS — M1990 Unspecified osteoarthritis, unspecified site: Secondary | ICD-10-CM | POA: Diagnosis not present

## 2014-10-28 DIAGNOSIS — Z803 Family history of malignant neoplasm of breast: Secondary | ICD-10-CM | POA: Diagnosis not present

## 2014-10-28 DIAGNOSIS — C50212 Malignant neoplasm of upper-inner quadrant of left female breast: Secondary | ICD-10-CM

## 2014-10-28 DIAGNOSIS — C50919 Malignant neoplasm of unspecified site of unspecified female breast: Secondary | ICD-10-CM | POA: Diagnosis not present

## 2014-10-28 DIAGNOSIS — Z8781 Personal history of (healed) traumatic fracture: Secondary | ICD-10-CM | POA: Diagnosis not present

## 2014-10-28 DIAGNOSIS — R269 Unspecified abnormalities of gait and mobility: Secondary | ICD-10-CM | POA: Diagnosis not present

## 2014-10-28 DIAGNOSIS — Z880 Allergy status to penicillin: Secondary | ICD-10-CM | POA: Diagnosis not present

## 2014-10-28 DIAGNOSIS — C801 Malignant (primary) neoplasm, unspecified: Secondary | ICD-10-CM | POA: Diagnosis not present

## 2014-10-28 DIAGNOSIS — C50911 Malignant neoplasm of unspecified site of right female breast: Secondary | ICD-10-CM | POA: Diagnosis not present

## 2014-10-28 DIAGNOSIS — Z170 Estrogen receptor positive status [ER+]: Secondary | ICD-10-CM | POA: Diagnosis not present

## 2014-10-28 DIAGNOSIS — D649 Anemia, unspecified: Secondary | ICD-10-CM | POA: Diagnosis not present

## 2014-10-28 DIAGNOSIS — N63 Unspecified lump in breast: Secondary | ICD-10-CM | POA: Diagnosis present

## 2014-10-28 DIAGNOSIS — C55 Malignant neoplasm of uterus, part unspecified: Secondary | ICD-10-CM | POA: Diagnosis not present

## 2014-10-28 LAB — GLUCOSE, CAPILLARY: Glucose-Capillary: 122 mg/dL — ABNORMAL HIGH (ref 70–99)

## 2014-10-28 MED ORDER — FLUDEOXYGLUCOSE F - 18 (FDG) INJECTION
5.8000 | Freq: Once | INTRAVENOUS | Status: AC | PRN
  Administered 2014-10-28: 5.8 via INTRAVENOUS

## 2014-10-30 ENCOUNTER — Encounter (HOSPITAL_COMMUNITY): Payer: Self-pay | Admitting: *Deleted

## 2014-10-30 ENCOUNTER — Other Ambulatory Visit (INDEPENDENT_AMBULATORY_CARE_PROVIDER_SITE_OTHER): Payer: Self-pay | Admitting: General Surgery

## 2014-10-30 MED ORDER — CHLORHEXIDINE GLUCONATE 4 % EX LIQD
1.0000 "application " | Freq: Once | CUTANEOUS | Status: DC
  Filled 2014-10-30: qty 15

## 2014-10-30 MED ORDER — VANCOMYCIN HCL IN DEXTROSE 1-5 GM/200ML-% IV SOLN
1000.0000 mg | INTRAVENOUS | Status: AC
  Administered 2014-10-31: 1000 mg via INTRAVENOUS
  Filled 2014-10-30 (×2): qty 200

## 2014-10-30 NOTE — H&P (Signed)
[name]  Location: [facility] Patient #: [id number] DOB: 1920-11-05 Widowed / Language: [place] / Race: White Female     History of Present Illness  Patient words: breast f/u.  The patient is a 80 year old female who presents with a complaint of recurrent cancer right breast. Biopsy of her right breast skin lesion shows triple negative breast cancer. She has no complaints about the wound healing. She saw Dr. Mariel Lee and we had a phone conversation He wants me to insert a Port-A-Cath and biopsy to other skin nodules, one just under the clavicle and one below the inframammary crease.  she is here with her family and we have discussed all of this and she is perfectly willing. I discussed the indications, details, techniques, and numerous risk of Port-A-Cath insertion. We discussed the risk of bleeding, infection, failure to work, failure to insert, pneumothorax, and other unforeseen problems. They all understand these issues. All other questions were answered. They agree with this plan. Apparently the chemotherapy is scheduled to start next Tuesday. We will see if we can find a hole in the schedule to get this done.   Allergies  Penicillin G Benzathine & Proc *PENICILLINS*  Medication History Vitamin A (50000IU Capsule, Oral) Active. Baby Aspirin (81MG  Tablet Chewable, Oral) Active. Calcium-Vitamin D (600MG  Tablet Chewable, Oral) Active. Multivitamins (Oral) Active. Cranberry (400MG  Capsule, Oral) Active.  Vitals  10/30/2014 10:40 AM Weight: 114 lb Height: 64in Body Surface Area: 1.53 m Body Mass Index: 19.57 kg/m Pulse: 73 (Regular)  BP: 134/82 (Sitting, Left Arm, Standard)    Physical Exam  General Note: Delightful. Alert. Elderly. No distress. A little feeble.   Head and Neck Note: No adenopathy or mass in neck. Good range of motion.   Chest and Lung Exam Note: Clear to auscultation bilaterally. Anatomy of clavicles  and sternum normal.   Breast Note: Multiple metastatic nodules of the right breast skin. There is also a 4 mm nodule in the infraclavicular area medially. There is also an nevus about 3 cm below the inframammary crease. That looks more benign.   Cardiovascular Note: Regular rate and rhythm. No murmur.     Assessment & Plan  RECURRENT BREAST CANCER, RIGHT (174.9  C50.911) Current Plans  Schedule for Surgery The biopsy that Dr. Derrell Lee performed on November 25 shows recurrent breast cancer. This is a triple negative breast cancer, and you he will need to have chemotherapy to try and control this. If the chemotherapy doesn't work you might need to have a mastectomy, but hopefully not Dr. Derrell Lee has discussed your treatment plan with Dr. Laurie Panda We are going to schedule you for Port-A-Cath insertion, and for excisional biopsy of 2 other skin nodules on your right chest wall. We have discussed the details and risks of this surgery. HISTORY OF FRACTURE OF LEFT HIP (V15.51  Z87.81) FAMILY HISTORY OF BREAST CANCER (V16.3  Z80.3)   Clifford Coudriet M. [name], M.D., [facility] General and Minimally invasive Surgery Breast and Colorectal Surgery Office:   [phone] Pager:   [phone]

## 2014-10-31 ENCOUNTER — Ambulatory Visit (HOSPITAL_COMMUNITY)
Admission: RE | Admit: 2014-10-31 | Discharge: 2014-10-31 | Disposition: A | Discharge status: Home / Self Care | Payer: Medicare Other | Source: Ambulatory Visit | Attending: General Surgery | Admitting: General Surgery

## 2014-10-31 ENCOUNTER — Ambulatory Visit (HOSPITAL_COMMUNITY): Payer: Medicare Other | Admitting: Anesthesiology

## 2014-10-31 ENCOUNTER — Ambulatory Visit (HOSPITAL_COMMUNITY): Payer: Medicare Other

## 2014-10-31 ENCOUNTER — Encounter (HOSPITAL_COMMUNITY): Payer: Self-pay | Admitting: *Deleted

## 2014-10-31 ENCOUNTER — Encounter (HOSPITAL_COMMUNITY)
Admission: RE | Disposition: A | Discharge status: Home / Self Care | Payer: Self-pay | Source: Ambulatory Visit | Attending: General Surgery

## 2014-10-31 DIAGNOSIS — C50911 Malignant neoplasm of unspecified site of right female breast: Secondary | ICD-10-CM

## 2014-10-31 DIAGNOSIS — D649 Anemia, unspecified: Secondary | ICD-10-CM | POA: Insufficient documentation

## 2014-10-31 DIAGNOSIS — Z8781 Personal history of (healed) traumatic fracture: Secondary | ICD-10-CM | POA: Insufficient documentation

## 2014-10-31 DIAGNOSIS — Z170 Estrogen receptor positive status [ER+]: Secondary | ICD-10-CM | POA: Insufficient documentation

## 2014-10-31 DIAGNOSIS — Z803 Family history of malignant neoplasm of breast: Secondary | ICD-10-CM | POA: Insufficient documentation

## 2014-10-31 DIAGNOSIS — C55 Malignant neoplasm of uterus, part unspecified: Secondary | ICD-10-CM | POA: Insufficient documentation

## 2014-10-31 DIAGNOSIS — R269 Unspecified abnormalities of gait and mobility: Secondary | ICD-10-CM | POA: Insufficient documentation

## 2014-10-31 DIAGNOSIS — C50919 Malignant neoplasm of unspecified site of unspecified female breast: Secondary | ICD-10-CM | POA: Insufficient documentation

## 2014-10-31 DIAGNOSIS — M1990 Unspecified osteoarthritis, unspecified site: Secondary | ICD-10-CM | POA: Insufficient documentation

## 2014-10-31 DIAGNOSIS — Z95828 Presence of other vascular implants and grafts: Secondary | ICD-10-CM

## 2014-10-31 DIAGNOSIS — Z880 Allergy status to penicillin: Secondary | ICD-10-CM | POA: Diagnosis not present

## 2014-10-31 DIAGNOSIS — C801 Malignant (primary) neoplasm, unspecified: Secondary | ICD-10-CM | POA: Diagnosis not present

## 2014-10-31 DIAGNOSIS — Z90710 Acquired absence of both cervix and uterus: Secondary | ICD-10-CM | POA: Insufficient documentation

## 2014-10-31 DIAGNOSIS — Z853 Personal history of malignant neoplasm of breast: Secondary | ICD-10-CM | POA: Diagnosis not present

## 2014-10-31 HISTORY — DX: Personal history of urinary (tract) infections: Z87.440

## 2014-10-31 HISTORY — DX: Personal history of other infectious and parasitic diseases: Z86.19

## 2014-10-31 HISTORY — PX: PORTACATH PLACEMENT: SHX2246

## 2014-10-31 HISTORY — PX: LIPOMA EXCISION: SHX5283

## 2014-10-31 HISTORY — DX: Pneumonia, unspecified organism: J18.9

## 2014-10-31 HISTORY — DX: Constipation, unspecified: K59.00

## 2014-10-31 HISTORY — DX: Failed or difficult intubation, initial encounter: T88.4XXA

## 2014-10-31 HISTORY — DX: Personal history of other medical treatment: Z92.89

## 2014-10-31 HISTORY — DX: Personal history of (healed) traumatic fracture: Z87.81

## 2014-10-31 HISTORY — DX: Anemia, unspecified: D64.9

## 2014-10-31 LAB — BASIC METABOLIC PANEL
Anion gap: 13 (ref 5–15)
BUN: 15 mg/dL (ref 6–23)
CO2: 26 mEq/L (ref 19–32)
CREATININE: 0.46 mg/dL — AB (ref 0.50–1.10)
Calcium: 9.1 mg/dL (ref 8.4–10.5)
Chloride: 101 mEq/L (ref 96–112)
GFR calc non Af Amer: 84 mL/min — ABNORMAL LOW (ref >90)
Glucose, Bld: 105 mg/dL — ABNORMAL HIGH (ref 70–99)
Potassium: 3.9 mEq/L (ref 3.7–5.3)
SODIUM: 140 meq/L (ref 137–147)

## 2014-10-31 LAB — CBC
HEMATOCRIT: 39.4 % (ref 36.0–46.0)
HEMOGLOBIN: 12.7 g/dL (ref 12.0–15.0)
MCH: 28.5 pg (ref 26.0–34.0)
MCHC: 32.2 g/dL (ref 30.0–36.0)
MCV: 88.5 fL (ref 78.0–100.0)
Platelets: 292 10*3/uL (ref 150–400)
RBC: 4.45 MIL/uL (ref 3.87–5.11)
RDW: 14.6 % (ref 11.5–15.5)
WBC: 6.3 10*3/uL (ref 4.0–10.5)

## 2014-10-31 SURGERY — INSERTION, TUNNELED CENTRAL VENOUS DEVICE, WITH PORT
Anesthesia: General | Site: Chest | Laterality: Right

## 2014-10-31 MED ORDER — PROPOFOL 10 MG/ML IV BOLUS
INTRAVENOUS | Status: AC
  Filled 2014-10-31: qty 20

## 2014-10-31 MED ORDER — ONDANSETRON HCL 4 MG/2ML IJ SOLN
INTRAMUSCULAR | Status: DC | PRN
Start: 2014-10-31 — End: 2014-10-31
  Administered 2014-10-31: 4 mg via INTRAVENOUS

## 2014-10-31 MED ORDER — 0.9 % SODIUM CHLORIDE (POUR BTL) OPTIME
TOPICAL | Status: DC | PRN
  Administered 2014-10-31: 1000 mL

## 2014-10-31 MED ORDER — FENTANYL CITRATE 0.05 MG/ML IJ SOLN: 25.0000 ug | INTRAMUSCULAR | Status: DC | PRN

## 2014-10-31 MED ORDER — LIDOCAINE HCL (CARDIAC) 20 MG/ML IV SOLN
INTRAVENOUS | Status: AC
  Filled 2014-10-31: qty 5

## 2014-10-31 MED ORDER — LIDOCAINE-EPINEPHRINE 1 %-1:100000 IJ SOLN
INTRAMUSCULAR | Status: AC
  Filled 2014-10-31: qty 1

## 2014-10-31 MED ORDER — EPHEDRINE SULFATE 50 MG/ML IJ SOLN
INTRAMUSCULAR | Status: DC | PRN
  Administered 2014-10-31 (×2): 10 mg via INTRAVENOUS

## 2014-10-31 MED ORDER — MEPERIDINE HCL 25 MG/ML IJ SOLN: 6.2500 mg | INTRAMUSCULAR | Status: DC | PRN

## 2014-10-31 MED ORDER — HEPARIN SOD (PORK) LOCK FLUSH 100 UNIT/ML IV SOLN
INTRAVENOUS | Status: AC
  Filled 2014-10-31: qty 5

## 2014-10-31 MED ORDER — FENTANYL CITRATE 0.05 MG/ML IJ SOLN
INTRAMUSCULAR | Status: AC
  Filled 2014-10-31: qty 5

## 2014-10-31 MED ORDER — HEPARIN SOD (PORK) LOCK FLUSH 100 UNIT/ML IV SOLN
INTRAVENOUS | Status: DC | PRN
  Administered 2014-10-31: 500 [IU] via INTRAVENOUS

## 2014-10-31 MED ORDER — PROMETHAZINE HCL 25 MG/ML IJ SOLN: 6.2500 mg | INTRAMUSCULAR | Status: DC | PRN

## 2014-10-31 MED ORDER — FENTANYL CITRATE 0.05 MG/ML IJ SOLN
INTRAMUSCULAR | Status: DC | PRN
  Administered 2014-10-31 (×2): 50 ug via INTRAVENOUS

## 2014-10-31 MED ORDER — LIDOCAINE HCL (CARDIAC) 20 MG/ML IV SOLN
INTRAVENOUS | Status: DC | PRN
  Administered 2014-10-31 (×2): 50 mg via INTRAVENOUS

## 2014-10-31 MED ORDER — BUPIVACAINE-EPINEPHRINE (PF) 0.5% -1:200000 IJ SOLN
INTRAMUSCULAR | Status: DC | PRN
  Administered 2014-10-31: 30 mL via PERINEURAL

## 2014-10-31 MED ORDER — BUPIVACAINE-EPINEPHRINE (PF) 0.5% -1:200000 IJ SOLN
INTRAMUSCULAR | Status: AC
  Filled 2014-10-31: qty 30

## 2014-10-31 MED ORDER — EPHEDRINE SULFATE 50 MG/ML IJ SOLN
INTRAMUSCULAR | Status: AC
  Filled 2014-10-31: qty 1

## 2014-10-31 MED ORDER — SODIUM CHLORIDE 0.9 % IR SOLN
Status: DC | PRN
  Administered 2014-10-31: 4 mL

## 2014-10-31 MED ORDER — ONDANSETRON HCL 4 MG/2ML IJ SOLN
INTRAMUSCULAR | Status: AC
  Filled 2014-10-31: qty 2

## 2014-10-31 MED ORDER — HYDROCODONE-ACETAMINOPHEN 5-325 MG PO TABS: 1.0000 | ORAL_TABLET | Freq: Four times a day (QID) | ORAL | Status: DC | PRN

## 2014-10-31 MED ORDER — PROPOFOL 10 MG/ML IV BOLUS
INTRAVENOUS | Status: DC | PRN
  Administered 2014-10-31: 100 mg via INTRAVENOUS

## 2014-10-31 MED ORDER — LACTATED RINGERS IV SOLN
INTRAVENOUS | Status: DC
  Administered 2014-10-31: 10:00:00 via INTRAVENOUS

## 2014-10-31 SURGICAL SUPPLY — 67 items
APL SKNCLS STERI-STRIP NONHPOA (GAUZE/BANDAGES/DRESSINGS) ×2
BAG DECANTER FOR FLEXI CONT (MISCELLANEOUS) ×6 IMPLANT
BENZOIN TINCTURE PRP APPL 2/3 (GAUZE/BANDAGES/DRESSINGS) ×3 IMPLANT
BLADE SURG 11 STRL SS (BLADE) ×3 IMPLANT
BLADE SURG 15 STRL LF DISP TIS (BLADE) ×4 IMPLANT
BLADE SURG 15 STRL SS (BLADE) ×4
CANISTER SUCTION 2500CC (MISCELLANEOUS) IMPLANT
CHLORAPREP W/TINT 26ML (MISCELLANEOUS) ×3 IMPLANT
COVER SURGICAL LIGHT HANDLE (MISCELLANEOUS) ×3 IMPLANT
COVER TRANSDUCER ULTRASND (DRAPES) IMPLANT
COVER TRANSDUCER ULTRASND GEL (DRAPE) IMPLANT
CRADLE DONUT ADULT HEAD (MISCELLANEOUS) ×3 IMPLANT
DECANTER SPIKE VIAL GLASS SM (MISCELLANEOUS) IMPLANT
DRAPE C-ARM 42X72 X-RAY (DRAPES) ×3 IMPLANT
DRAPE LAPAROSCOPIC ABDOMINAL (DRAPES) ×3 IMPLANT
DRAPE PED LAPAROTOMY (DRAPES) ×3 IMPLANT
DRAPE UTILITY XL STRL (DRAPES) ×6 IMPLANT
ELECT CAUTERY BLADE 6.4 (BLADE) ×3 IMPLANT
ELECT REM PT RETURN 9FT ADLT (ELECTROSURGICAL) ×3
ELECTRODE REM PT RTRN 9FT ADLT (ELECTROSURGICAL) ×2 IMPLANT
GAUZE SPONGE 4X4 12PLY STRL (GAUZE/BANDAGES/DRESSINGS) ×3 IMPLANT
GAUZE SPONGE 4X4 16PLY XRAY LF (GAUZE/BANDAGES/DRESSINGS) ×3 IMPLANT
GLOVE BIOGEL PI IND STRL 7.0 (GLOVE) ×4 IMPLANT
GLOVE BIOGEL PI IND STRL 7.5 (GLOVE) ×2 IMPLANT
GLOVE BIOGEL PI INDICATOR 7.0 (GLOVE) ×2
GLOVE BIOGEL PI INDICATOR 7.5 (GLOVE) ×1
GLOVE EUDERMIC 7 POWDERFREE (GLOVE) ×3 IMPLANT
GLOVE SURG SS PI 7.0 STRL IVOR (GLOVE) ×9 IMPLANT
GOWN STRL REUS W/ TWL LRG LVL3 (GOWN DISPOSABLE) ×8 IMPLANT
GOWN STRL REUS W/ TWL XL LVL3 (GOWN DISPOSABLE) ×2 IMPLANT
GOWN STRL REUS W/TWL LRG LVL3 (GOWN DISPOSABLE) ×4
GOWN STRL REUS W/TWL XL LVL3 (GOWN DISPOSABLE) ×2
INTRODUCER 13FR (MISCELLANEOUS) IMPLANT
INTRODUCER COOK 11FR (CATHETERS) IMPLANT
KIT BASIN OR (CUSTOM PROCEDURE TRAY) ×3 IMPLANT
KIT PORT POWER 8FR ISP CVUE (Catheter) IMPLANT
KIT PORT POWER 9.6FR MRI PREA (Catheter) IMPLANT
KIT PORT POWER ISP 8FR (Catheter) IMPLANT
KIT POWER CATH 8FR (Catheter) ×3 IMPLANT
KIT ROOM TURNOVER OR (KITS) ×3 IMPLANT
LIQUID BAND (GAUZE/BANDAGES/DRESSINGS) ×3 IMPLANT
NEEDLE HYPO 25GX1X1/2 BEV (NEEDLE) ×6 IMPLANT
NS IRRIG 1000ML POUR BTL (IV SOLUTION) ×3 IMPLANT
PACK SURGICAL SETUP 50X90 (CUSTOM PROCEDURE TRAY) ×3 IMPLANT
PAD ARMBOARD 7.5X6 YLW CONV (MISCELLANEOUS) ×6 IMPLANT
PENCIL BUTTON HOLSTER BLD 10FT (ELECTRODE) ×3 IMPLANT
SET INTRODUCER 12FR PACEMAKER (SHEATH) IMPLANT
SET SHEATH INTRODUCER 10FR (MISCELLANEOUS) IMPLANT
SHEATH COOK PEEL AWAY SET 9F (SHEATH) IMPLANT
SPECIMEN JAR SMALL (MISCELLANEOUS) ×3 IMPLANT
SPONGE LAP 4X18 X RAY DECT (DISPOSABLE) IMPLANT
STAPLER VISISTAT 35W (STAPLE) IMPLANT
STRIP CLOSURE SKIN 1/2X4 (GAUZE/BANDAGES/DRESSINGS) ×3 IMPLANT
SURGILUBE 3G PEEL PACK STRL (MISCELLANEOUS) IMPLANT
SUT MNCRL AB 4-0 PS2 18 (SUTURE) ×6 IMPLANT
SUT PROLENE 2 0 CT2 30 (SUTURE) ×3 IMPLANT
SUT VIC AB 3-0 SH 27 (SUTURE) ×3
SUT VIC AB 3-0 SH 27XBRD (SUTURE) ×2 IMPLANT
SYR 5ML LUER SLIP (SYRINGE) ×3 IMPLANT
SYR BULB 3OZ (MISCELLANEOUS) ×3 IMPLANT
SYR CONTROL 10ML LL (SYRINGE) ×3 IMPLANT
SYRINGE 10CC LL (SYRINGE) ×6 IMPLANT
TOWEL OR 17X24 6PK STRL BLUE (TOWEL DISPOSABLE) ×3 IMPLANT
TOWEL OR 17X26 10 PK STRL BLUE (TOWEL DISPOSABLE) ×3 IMPLANT
TUBE CONNECTING 12X1/4 (SUCTIONS) IMPLANT
WATER STERILE IRR 1000ML POUR (IV SOLUTION) ×3 IMPLANT
YANKAUER SUCT BULB TIP NO VENT (SUCTIONS) IMPLANT

## 2014-10-31 NOTE — Anesthesia Postprocedure Evaluation (Signed)
  Anesthesia Post-op Note  Patient: [name]  Procedure(s) Performed: Procedure(s): INSERTION PORT-A-CATH WITH ULTRA SOUND  (Left) EXCISION 2 SKIN LESIONS RIGHT CHEST WALL (Right)  Patient Location: PACU  Anesthesia Type:General  Level of Consciousness: awake and alert   Airway and Oxygen Therapy: Patient Spontanous Breathing and Patient connected to nasal cannula oxygen  Post-op Pain: mild  Post-op Assessment: Post-op Vital signs reviewed, Patient's Cardiovascular Status Stable, Respiratory Function Stable and Patent Airway  Post-op Vital Signs: Reviewed and stable  Last Vitals:  Filed Vitals:   10/31/14 1257  BP: 130/52  Pulse: 72  Temp:   Resp: 11    Complications: No apparent anesthesia complications

## 2014-10-31 NOTE — Anesthesia Preprocedure Evaluation (Signed)
Anesthesia Evaluation  Patient identified by MRN, date of birth, ID band Patient awake    Reviewed: Allergy & Precautions, H&P , NPO status , Patient's Chart, lab work & pertinent test results, reviewed documented beta blocker date and time   History of Anesthesia Complications (+) DIFFICULT AIRWAYHistory of anesthetic complications: difficult intubation 20 years ago, none since.  Airway        Dental   Pulmonary          Cardiovascular     Neuro/Psych    GI/Hepatic   Endo/Other    Renal/GU      Musculoskeletal   Abdominal   Peds  Hematology   Anesthesia Other Findings   Reproductive/Obstetrics                             Anesthesia Physical Anesthesia Plan  ASA: II  Anesthesia Plan: General   Post-op Pain Management:    Induction: Intravenous  Airway Management Planned: LMA, Oral ETT and Video Laryngoscope Planned  Additional Equipment:   Intra-op Plan:   Post-operative Plan:   Informed Consent: I have reviewed the patients History and Physical, chart, labs and discussed the procedure including the risks, benefits and alternatives for the proposed anesthesia with the patient or authorized representative who has indicated his/her understanding and acceptance.     Plan Discussed with:   Anesthesia Plan Comments: (Glide available )        Anesthesia Quick Evaluation

## 2014-10-31 NOTE — Op Note (Signed)
Patient Name:           [name]   Date of Surgery:        10/31/2014  Pre op Diagnosis:      Recurrent cancer right breast with multiple metastatic skin nodules  Post op Diagnosis:    Same  Procedure:                 Insertion of 8 French power port ClearVue tunneled venous vascular access device Use of fluoroscopy for guidance and positioning Excision 5 mm skin nodule right infraclavicular area, possible malignant  excision 1 cm pigmented nevus right costal margin, possibly malignant  Surgeon:                     Edsel Petrin. Dalbert Batman, M.D., FACS  Assistant:                      OR staff  Operative Indications:   The patient is a 80 year old female who presents with a complaint of recurrent cancer right breast. She has a history of bilateral breast cancer, receptor positive. Recent evaluation revealed 5-6 erythematous nodules of the right breast skin, one of which was bleeding. Punch biopsy in the office shows breast cancer, triple negative breast cancer. .  She saw Dr. Tressie Stalker and we had a phone conversation He wants me to insert a Port-A-Cath and biopsy to other skin nodules, one just under the clavicle and one below the inframammary crease. It is our plan to give her intravenous chemotherapy, hopefully downstaging tumors, and ultimately do a simple mastectomy. she is here with her family and we have discussed all of this and she is perfectly willing.   Operative Findings:       I was able to insert the port through the left subclavian vein, and catheter positioning in the OR looks good with the tip of the catheter in the superior vena cava near the right atrial junction and the catheter functions well. There was a 5 mm firm erythematous nodule of the right infraclavicular area medially  which is suspicious. There was a 1 cm.  flat nevus of the right costal margin which is indeterminate for malignancy. both of the skin lesions were excised. I removed the nylon suture from the right  breast from the recent biopsy.  Procedure in Detail:          Following the induction of general LMA anesthesia the patient was positioned with her arms at her sides and a small roll behind her shoulders. Her neck did not extend well. The neck and chest and upper abdomen were prepped and draped in a sterile fashion. Intravenous antibiotics were given. Surgical timeout was performed.      A left subclavian venipuncture was performed, and fortunately with a single pass I was able to get blood return and pass a guidewire. Fluoroscopy confirmed that the guidewire was in the superior  vena cava. Using fluoroscopy I drew a template on the chest wall to help guide positioning of the catheter. A small incision was made at the wire insertion site. A transverse incision was made about 3 cm below the left clavicle. A subcutaneous pocket was created. Using a tunneling device I passed the catheter from the wire insertion site to the port pocket site. Using the template on the chest wall I measured the catheter and cut it 22.5 cm in length. The catheter was then secured to the port with  the locking device and flushed with heparinized saline. The port was placed in the port pocket. I passed a dilator and peel-away sheath over the guidewire into the central venous circulation, removed the guidewire and dilator and threaded the catheter easily through the peel-away sheath and removed the peel-away sheath. I had excellent blood return and the catheter flushed easily. Fluoroscopy confirmed that the catheter tip was in the superior vena cava near the right atrial junction and there is no deformity of the catheter anywhere along its course. The port and catheter were flushed with concentrated heparin. I sutured the port to the pectoralis fascia with 3 interrupted sutures of 2-0 Prolene. The subcutaneous tissues were closed with interrupted 3-0 Vicryl and the 2 skin incisions were closed with subcuticular 4-0 Monocryl and Dermabond.      I then excised the 5 mm nodule in the right medial infraclavicular area and the 1 cm nevus in the right costal margin area. These were done with conservative elliptical incisions. The specimens were labeled separately and sent to the lab. The skin incisions were closed with subcuticular 4-0 Monocryl and Dermabond.      The patient tolerated the procedure well was taken to PACU in stable condition. EBL 10 mL. Counts correct. Complications none. A chest x-ray is planned.                     Edsel Petrin. Dalbert Batman, M.D., FACS General and Minimally Invasive Surgery Breast and Colorectal Surgery  10/31/2014 12:43 PM

## 2014-10-31 NOTE — Transfer of Care (Signed)
Immediate Anesthesia Transfer of Care Note  Patient: [name]  Procedure(s) Performed: Procedure(s): INSERTION PORT-A-CATH WITH ULTRA SOUND  (Left) EXCISION 2 SKIN LESIONS RIGHT CHEST WALL (Right)  Patient Location: PACU  Anesthesia Type:General  Level of Consciousness: awake  Airway & Oxygen Therapy: Patient Spontanous Breathing  Post-op Assessment: Report given to PACU RN and Post -op Vital signs reviewed and stable  Post vital signs: stable  Complications: No apparent anesthesia complications

## 2014-10-31 NOTE — Discharge Instructions (Signed)
° ° °  PORT-A-CATH: POST OP INSTRUCTIONS  Always review your discharge instruction sheet given to you by the facility where your surgery was performed.   1. A prescription for pain medication may be given to you upon discharge. Take your pain medication as prescribed, if needed. If narcotic pain medicine is not needed, then you make take acetaminophen (Tylenol) or ibuprofen (Advil) as needed.  2. Take your usually prescribed medications unless otherwise directed. 3. If you need a refill on your pain medication, please contact our office. All narcotic pain medicine now requires a paper prescription.  Phoned in and fax refills are no longer allowed by law.  Prescriptions will not be filled after 5 pm or on weekends.  4. You should follow a light diet for the remainder of the day after your procedure. 5. Most patients will experience some mild swelling and/or bruising in the area of the incision. It may take several days to resolve. 6. It is common to experience some constipation if taking pain medication after surgery. Increasing fluid intake and taking a stool softener (such as Colace) will usually help or prevent this problem from occurring. A mild laxative (Milk of Magnesia or Miralax) should be taken according to package directions if there are no bowel movements after 48 hours.  7. Unless discharge instructions indicate otherwise, you may remove your bandages 48 hours after surgery, and you may shower at that time. You may have steri-strips (small white skin tapes) in place directly over the incision.  These strips should be left on the skin for 7-10 days.  If your surgeon used Dermabond (skin glue) on the incision, you may shower in 24 hours.  The glue will flake off over the next 2-3 weeks.  8. If your port is left accessed at the end of surgery (needle left in port), the dressing cannot get wet and should only by changed by a healthcare professional. When the port is no longer accessed (when the  needle has been removed), follow step 7.   9. ACTIVITIES:  Limit activity involving your arms for the next 72 hours. Do no strenuous exercise or activity for 1 week. You may drive when you are no longer taking prescription pain medication, you can comfortably wear a seatbelt, and you can maneuver your car. 10.You may need to see your doctor in the office for a follow-up appointment.  Please       check with your doctor.  11.When you receive a new Port-a-Cath, you will get a product guide and        ID card.  Please keep them in case you need them.  WHEN TO CALL YOUR DOCTOR [phone]): 1. Fever over 101.0 2. Chills 3. Continued bleeding from incision 4. Increased redness and tenderness at the site 5. Shortness of breath, difficulty breathing   The clinic staff is available to answer your questions during regular business hours. Please dont hesitate to call and ask to speak to one of the nurses or medical assistants for clinical concerns. If you have a medical emergency, go to the nearest emergency room or call 911.  A surgeon from [facility] is always on call at the hospital.     For further information, please visit [email or web address]   OK to shower

## 2014-10-31 NOTE — Progress Notes (Signed)
Off monitor /pending cxr report

## 2014-10-31 NOTE — Interval H&P Note (Signed)
History and Physical Interval Note:  10/31/2014 10:46 AM  [name]  has presented today for surgery, with the diagnosis of recurrent breast cancer  The various methods of treatment have been discussed with the patient and family. After consideration of risks, benefits and other options for treatment, the patient has consented to  Procedure(s): INSERTION PORT-A-CATH WITH ULTRA SOUND  (N/A) EXCISION 2 SKIN LESIONS RIGHT CHEST WALL (Right) as a surgical intervention .  The patient's history has been reviewed, patient examined today, no change in status, stable for surgery.  I have reviewed the patient's chart and labs.  Questions were answered to the patient's satisfaction.     Adin Hector

## 2014-11-03 NOTE — Progress Notes (Signed)
Quick Note:  Inform patient of Pathology report,.tell her that the small nodule that was under her clavicle shows breast cancer. Tell her that the small mole below her breast is benign. Tell her that I will call her if she desires. Otherwise proceed with chemotherapy  Edsel Petrin. Dalbert Batman, M.D., FACS   ______

## 2014-11-04 ENCOUNTER — Encounter (HOSPITAL_COMMUNITY): Payer: Self-pay | Admitting: General Surgery

## 2014-11-04 NOTE — Progress Notes (Signed)
LMOm asking patient to return my call so that I may inform her of the pathology results.

## 2014-11-05 NOTE — Progress Notes (Signed)
Called patient and spoke with her daughter. Informed her of pathology results and to proceed with chemotherapy. Patient had first appt with Dr. Tressie Stalker 11/04/14.

## 2015-01-05 ENCOUNTER — Other Ambulatory Visit (INDEPENDENT_AMBULATORY_CARE_PROVIDER_SITE_OTHER): Payer: Self-pay | Admitting: General Surgery

## 2015-01-18 ENCOUNTER — Other Ambulatory Visit (INDEPENDENT_AMBULATORY_CARE_PROVIDER_SITE_OTHER): Payer: Self-pay | Admitting: General Surgery

## 2015-01-19 ENCOUNTER — Inpatient Hospital Stay (HOSPITAL_COMMUNITY): Admission: RE | Admit: 2015-01-19 | Payer: Self-pay | Source: Ambulatory Visit

## 2015-01-22 ENCOUNTER — Encounter (HOSPITAL_COMMUNITY)
Admission: RE | Admit: 2015-01-22 | Discharge: 2015-01-22 | Disposition: A | Discharge status: Home / Self Care | Payer: Medicare Other | Source: Ambulatory Visit | Attending: General Surgery | Admitting: General Surgery

## 2015-01-22 ENCOUNTER — Encounter (HOSPITAL_COMMUNITY): Payer: Self-pay

## 2015-01-22 ENCOUNTER — Other Ambulatory Visit (HOSPITAL_COMMUNITY): Payer: Self-pay | Admitting: *Deleted

## 2015-01-22 DIAGNOSIS — C50911 Malignant neoplasm of unspecified site of right female breast: Secondary | ICD-10-CM | POA: Insufficient documentation

## 2015-01-22 DIAGNOSIS — Z01812 Encounter for preprocedural laboratory examination: Secondary | ICD-10-CM | POA: Diagnosis not present

## 2015-01-22 HISTORY — DX: Respiratory tuberculosis unspecified: A15.9

## 2015-01-22 HISTORY — DX: Adverse effect of unspecified anesthetic, initial encounter: T41.45XA

## 2015-01-22 HISTORY — DX: Other complications of anesthesia, initial encounter: T88.59XA

## 2015-01-22 HISTORY — DX: Zoster without complications: B02.9

## 2015-01-22 HISTORY — DX: Cerebral infarction, unspecified: I63.9

## 2015-01-22 HISTORY — DX: Personal history of (healed) traumatic fracture: Z87.81

## 2015-01-22 LAB — CBC
HEMATOCRIT: 33.3 % — AB (ref 36.0–46.0)
Hemoglobin: 11.1 g/dL — ABNORMAL LOW (ref 12.0–15.0)
MCH: 30.6 pg (ref 26.0–34.0)
MCHC: 33.3 g/dL (ref 30.0–36.0)
MCV: 91.7 fL (ref 78.0–100.0)
Platelets: 238 10*3/uL (ref 150–400)
RBC: 3.63 MIL/uL — ABNORMAL LOW (ref 3.87–5.11)
RDW: 17.1 % — ABNORMAL HIGH (ref 11.5–15.5)
WBC: 3.7 10*3/uL — ABNORMAL LOW (ref 4.0–10.5)

## 2015-01-22 LAB — BASIC METABOLIC PANEL
Anion gap: 5 (ref 5–15)
BUN: 17 mg/dL (ref 6–23)
CHLORIDE: 108 mmol/L (ref 96–112)
CO2: 26 mmol/L (ref 19–32)
CREATININE: 0.53 mg/dL (ref 0.50–1.10)
Calcium: 9 mg/dL (ref 8.4–10.5)
GFR calc Af Amer: >90 mL/min (ref >90)
GFR calc non Af Amer: 80 mL/min — ABNORMAL LOW (ref >90)
GLUCOSE: 139 mg/dL — AB (ref 70–99)
POTASSIUM: 4.2 mmol/L (ref 3.5–5.1)
SODIUM: 139 mmol/L (ref 135–145)

## 2015-01-22 NOTE — Pre-Procedure Instructions (Signed)
[name]  01/22/2015   Your procedure is scheduled on:  Monday, January 26, 2015 at 7:30 AM.   Report to [facility] Entrance "A" Admitting Office at 5:30 AM.   Call this number if you have problems the morning of surgery: [phone]               Any questions prior to day of surgery, please call [phone] between 8 & 4 PM.   Remember:   Do not eat food or drink liquids after midnight Sunday, 01/25/15.   Take these medicines the morning of surgery with A SIP OF WATER: None  Stop Vitamins and Herbal medications as of today.   Do not wear jewelry, make-up or nail polish.  Do not wear lotions, powders, or perfumes. You may wear deodorant.  Do not shave 48 hours prior to surgery.   Do not bring valuables to the hospital.  [facility] is not responsible                  for any belongings or valuables.               Contacts, dentures or bridgework may not be worn into surgery.  Leave suitcase in the car. After surgery it may be brought to your room.  For patients admitted to the hospital, discharge time is determined by your                treatment team.               Patients discharged the day of surgery will not be allowed to drive home.    Special Instructions: [place] - Preparing for Surgery  Before surgery, you can play an important role.  Because skin is not sterile, your skin needs to be as free of germs as possible.  You can reduce the number of germs on you skin by washing with CHG (chlorahexidine gluconate) soap before surgery.  CHG is an antiseptic cleaner which kills germs and bonds with the skin to continue killing germs even after washing.  Please DO NOT use if you have an allergy to CHG or antibacterial soaps.  If your skin becomes reddened/irritated stop using the CHG and inform your nurse when you arrive at Short Stay.  Do not shave (including legs and underarms) for at least 48 hours prior to the first CHG shower.  You may shave your face.  Please  follow these instructions carefully:   1.  Shower with CHG Soap the night before surgery and the                                morning of Surgery.  2.  If you choose to wash your hair, wash your hair first as usual with your       normal shampoo.  3.  After you shampoo, rinse your hair and body thoroughly to remove the                      Shampoo.  4.  Use CHG as you would any other liquid soap.  You can apply chg directly       to the skin and wash gently with scrungie or a clean washcloth.  5.  Apply the CHG Soap to your body ONLY FROM THE NECK DOWN.        Do not use on open wounds  or open sores.  Avoid contact with your eyes, ears, mouth and genitals (private parts).  Wash genitals (private parts) with your normal soap.  6.  Wash thoroughly, paying special attention to the area where your surgery        will be performed.  7.  Thoroughly rinse your body with warm water from the neck down.  8.  DO NOT shower/wash with your normal soap after using and rinsing off       the CHG Soap.  9.  Pat yourself dry with a clean towel.            10.  Wear clean pajamas.            11.  Place clean sheets on your bed the night of your first shower and do not        sleep with pets.  Day of Surgery  Do not apply any lotions/deodorants the morning of surgery.  Please wear clean clothes to the hospital.     Please read over the following fact sheets that you were given: Pain Booklet, Coughing and Deep Breathing and Surgical Site Infection Prevention

## 2015-01-23 ENCOUNTER — Encounter (HOSPITAL_COMMUNITY): Payer: Self-pay | Admitting: General Surgery

## 2015-01-23 NOTE — H&P (Signed)
[name]  Location: [facility] Patient #: [id number] DOB: Jul 20, 1920 Widowed / Language: [place] / Race: White Female       History of Present Illness  Patient words: rt breast follow up.  The patient is a 81 year old female who presents with breast cancer. She has multifocal recurrent cancer in her right breast. She has completed neoadjuvant chemotherapy with reasonably good response. She is scheduled for right total mastectomy on March 7. She gets chemotherapy weekly. She has a history of bilateral breast cancer which was receptor positive with bilateral lumpectomies. Recent evaluation revealed multiple rapidly growing erythematous nodules of the right breast skin. One of these was bleeding. Punch biopsy office showed triple negative breast cancer. Care plan was devised in coordination with Dr. Mariel Sleet and Dr. Basilio Cairo. I placed a Port-A-Cath on December 11. She has received weekly carboplatin and Taxol and has responded. The bleeding is stopped. There are less numerous nodules. They are flatter and smaller. She feels fine otherwise and is not had any deterioration in her physical status. She and her daughter are here today. She is strongly motivated to go ahead with the mastectomy. She has questions about chemotherapy. I told her to go ahead with her chemotherapy tomorrow and again 1 week from today and that March 7 was the date that Dr. Mariel Sleet and I had planned.  We had a long talk about goals of treatment and the mastectomy procedure in general. I told her that our hope was that with chemotherapy followed by surgery followed by radiation therapy and chemotherapy that we could lower the chance of chest wall recurrence. I told her I could not guarantee that it would not recur. She understands this. I discussed indications, details, techniques, and numerous risk of this surgery with her. She is aware of the risk of bleeding, infection, skin  necrosis, reoperation for complications, arm swelling, arm numbness, cardiac pulmonary and thromboembolic problems. She understands these issues. All of her questions were answered. She is strongly in favor of going ahead with the mastectomy. The port will be left in   Allergies  Penicillin G Benzathine & Proc *PENICILLINS*  Medication History  Vitamin A (50000IU Capsule, Oral) Active. Baby Aspirin (81MG  Tablet Chewable, Oral) Active. Calcium-Vitamin D (600MG  Tablet Chewable, Oral) Active. Multivitamins (Oral) Active. Cranberry (400MG  Capsule, Oral) Active. Norco (5-325MG  Tablet, Oral) Active.  Vitals   Weight: 119.6 lb Height: 60in Body Surface Area: 1.52 m Body Mass Index: 23.36 kg/m Temp.: 96.29F  Pulse: 70 (Regular)  Resp.: 16 (Unlabored)  BP: 128/60 (Sitting, Left Arm, Standard)    Physical Exam  General Note: Pleasant. Alert. He looks extremely good for her age. Oriented. Cooperative. Good insight. thin   Head and Neck Note: No adenopathy or mass  Heart: Regular rate and rhythm.  No murmur.  No ectopy.  Chest and Lung Exam Note: Lungs are clear to auscultation. No chest wall tenderness.   Breast Note: Bilateral lumpectomy scars. Port left infraclavicular clavicular area looks good. Left breast exam reveals some atrophy but no dominant mass or other skin change. No axillary adenopathy. Right breast reveals 7 or 8 erythematous nodules which are smaller and flatter than they used to be. They are not bleeding. The breast as lumpy and thickened of the scan is not thickened. No axillary mass.     Assessment & Plan RECURRENT BREAST CANCER, RIGHT (174.9  C50.911)  Current Plans:    Schedule for Surgery The multiple areas of recurrent cancer in your right  best are responding nicely to the chemotherapy. The bleeding has stopped and all of the nodules are smaller. There appears to be less nodules. You are scheduled for right total  mastectomy on March 7. Continue your chemotherapy on schedule Dr. Mariel Sleet wants to keep the Port-A-Cath in for now We have discussed the techniques and risks of this surgery in detail. I think we have answered all of your questions  HISTORY OF BILATERAL BREAST CANCER (V10.3  Z85.3) HISTORY OF UTERINE CANCER (V10.42  Z85.42) HISTORY OF FRACTURE OF LEFT HIP (V15.51  Z87.81) FAMILY HISTORY OF BREAST CANCER (V16.3  Z80.3)    Milisa Kimbell M. Derrell Lolling, M.D., [facility] General and Minimally invasive Surgery Breast and Colorectal Surgery Office:   [phone] Pager:   [phone]

## 2015-01-25 MED ORDER — VANCOMYCIN HCL IN DEXTROSE 1-5 GM/200ML-% IV SOLN
1000.0000 mg | INTRAVENOUS | Status: AC
  Administered 2015-01-26: 1000 mg via INTRAVENOUS

## 2015-01-26 ENCOUNTER — Ambulatory Visit (HOSPITAL_COMMUNITY): Payer: Medicare Other | Admitting: Anesthesiology

## 2015-01-26 ENCOUNTER — Encounter (HOSPITAL_COMMUNITY): Payer: Self-pay

## 2015-01-26 ENCOUNTER — Ambulatory Visit (HOSPITAL_COMMUNITY)
Admission: RE | Admit: 2015-01-26 | Discharge: 2015-01-27 | Disposition: A | Discharge status: Home / Self Care | Payer: Medicare Other | Source: Ambulatory Visit | Attending: General Surgery | Admitting: General Surgery

## 2015-01-26 ENCOUNTER — Encounter (HOSPITAL_COMMUNITY)
Admission: RE | Disposition: A | Discharge status: Home / Self Care | Payer: Self-pay | Source: Ambulatory Visit | Attending: General Surgery

## 2015-01-26 DIAGNOSIS — Z7982 Long term (current) use of aspirin: Secondary | ICD-10-CM | POA: Insufficient documentation

## 2015-01-26 DIAGNOSIS — Z8542 Personal history of malignant neoplasm of other parts of uterus: Secondary | ICD-10-CM | POA: Insufficient documentation

## 2015-01-26 DIAGNOSIS — Z880 Allergy status to penicillin: Secondary | ICD-10-CM | POA: Diagnosis not present

## 2015-01-26 DIAGNOSIS — Z9221 Personal history of antineoplastic chemotherapy: Secondary | ICD-10-CM | POA: Diagnosis not present

## 2015-01-26 DIAGNOSIS — Z79899 Other long term (current) drug therapy: Secondary | ICD-10-CM | POA: Diagnosis not present

## 2015-01-26 DIAGNOSIS — C50911 Malignant neoplasm of unspecified site of right female breast: Secondary | ICD-10-CM | POA: Diagnosis not present

## 2015-01-26 DIAGNOSIS — Z803 Family history of malignant neoplasm of breast: Secondary | ICD-10-CM | POA: Insufficient documentation

## 2015-01-26 HISTORY — PX: TOTAL MASTECTOMY: SHX6129

## 2015-01-26 HISTORY — PX: MASTECTOMY: SHX3

## 2015-01-26 LAB — CBC
HCT: 32.8 % — ABNORMAL LOW (ref 36.0–46.0)
HEMOGLOBIN: 11 g/dL — AB (ref 12.0–15.0)
MCH: 30.5 pg (ref 26.0–34.0)
MCHC: 33.5 g/dL (ref 30.0–36.0)
MCV: 90.9 fL (ref 78.0–100.0)
Platelets: 201 10*3/uL (ref 150–400)
RBC: 3.61 MIL/uL — ABNORMAL LOW (ref 3.87–5.11)
RDW: 16.9 % — AB (ref 11.5–15.5)
WBC: 5.1 10*3/uL (ref 4.0–10.5)

## 2015-01-26 LAB — CREATININE, SERUM
Creatinine, Ser: 0.46 mg/dL — ABNORMAL LOW (ref 0.50–1.10)
GFR calc Af Amer: >90 mL/min (ref >90)
GFR calc non Af Amer: 84 mL/min — ABNORMAL LOW (ref >90)

## 2015-01-26 SURGERY — MASTECTOMY, SIMPLE
Anesthesia: General | Laterality: Right

## 2015-01-26 MED ORDER — EPHEDRINE SULFATE 50 MG/ML IJ SOLN
INTRAMUSCULAR | Status: DC | PRN
  Administered 2015-01-26 (×2): 5 mg via INTRAVENOUS

## 2015-01-26 MED ORDER — DEXAMETHASONE SODIUM PHOSPHATE 4 MG/ML IJ SOLN
INTRAMUSCULAR | Status: DC | PRN
  Administered 2015-01-26: 4 mg via INTRAVENOUS

## 2015-01-26 MED ORDER — ZOLPIDEM TARTRATE 5 MG PO TABS
5.0000 mg | ORAL_TABLET | Freq: Every day | ORAL | Status: DC
  Administered 2015-01-26: 5 mg via ORAL
  Filled 2015-01-26: qty 1

## 2015-01-26 MED ORDER — ENOXAPARIN SODIUM 30 MG/0.3ML ~~LOC~~ SOLN
30.0000 mg | SUBCUTANEOUS | Status: DC
  Administered 2015-01-27: 30 mg via SUBCUTANEOUS
  Filled 2015-01-26: qty 0.3

## 2015-01-26 MED ORDER — HYDROCODONE-ACETAMINOPHEN 5-325 MG PO TABS: 1.0000 | ORAL_TABLET | Freq: Four times a day (QID) | ORAL | Status: DC | PRN

## 2015-01-26 MED ORDER — RISAQUAD PO CAPS
1.0000 | ORAL_CAPSULE | Freq: Every day | ORAL | Status: DC
  Administered 2015-01-26 – 2015-01-27 (×2): 1 via ORAL
  Filled 2015-01-26 (×2): qty 1

## 2015-01-26 MED ORDER — LACTATED RINGERS IV SOLN
INTRAVENOUS | Status: DC | PRN
  Administered 2015-01-26: 07:00:00 via INTRAVENOUS

## 2015-01-26 MED ORDER — PROBIOTIC DAILY PO CAPS: ORAL_CAPSULE | Freq: Every day | ORAL | Status: DC

## 2015-01-26 MED ORDER — ONDANSETRON HCL 4 MG/2ML IJ SOLN: 4.0000 mg | Freq: Four times a day (QID) | INTRAMUSCULAR | Status: DC | PRN

## 2015-01-26 MED ORDER — MULTIVITAMINS PO CAPS: 1.0000 | ORAL_CAPSULE | Freq: Every day | ORAL | Status: DC

## 2015-01-26 MED ORDER — 0.9 % SODIUM CHLORIDE (POUR BTL) OPTIME
TOPICAL | Status: DC | PRN
  Administered 2015-01-26: 1000 mL

## 2015-01-26 MED ORDER — HYDROMORPHONE HCL 1 MG/ML IJ SOLN
0.5000 mg | INTRAMUSCULAR | Status: DC | PRN
  Administered 2015-01-26: 0.5 mg via INTRAVENOUS
  Filled 2015-01-26: qty 1

## 2015-01-26 MED ORDER — HYDROMORPHONE HCL 1 MG/ML IJ SOLN: 0.2500 mg | INTRAMUSCULAR | Status: DC | PRN

## 2015-01-26 MED ORDER — HYDROCODONE-ACETAMINOPHEN 5-325 MG PO TABS
1.0000 | ORAL_TABLET | ORAL | Status: DC | PRN
  Administered 2015-01-27: 2 via ORAL
  Filled 2015-01-26: qty 1
  Filled 2015-01-26: qty 2

## 2015-01-26 MED ORDER — MELATONIN 3 MG PO TABS: 3.0000 mg | ORAL_TABLET | Freq: Every day | ORAL | Status: DC

## 2015-01-26 MED ORDER — PROPOFOL 10 MG/ML IV BOLUS
INTRAVENOUS | Status: DC | PRN
  Administered 2015-01-26: 70 mg via INTRAVENOUS
  Administered 2015-01-26: 30 mg via INTRAVENOUS

## 2015-01-26 MED ORDER — ADULT MULTIVITAMIN W/MINERALS CH
1.0000 | ORAL_TABLET | Freq: Every day | ORAL | Status: DC
  Administered 2015-01-26 – 2015-01-27 (×2): 1 via ORAL
  Filled 2015-01-26 (×2): qty 1

## 2015-01-26 MED ORDER — VANCOMYCIN HCL IN DEXTROSE 1-5 GM/200ML-% IV SOLN: 1000.0000 mg | INTRAVENOUS | Status: DC

## 2015-01-26 MED ORDER — ASPIRIN 81 MG PO TABS: 81.0000 mg | ORAL_TABLET | Freq: Every day | ORAL | Status: DC

## 2015-01-26 MED ORDER — POTASSIUM CHLORIDE IN NACL 20-0.9 MEQ/L-% IV SOLN
INTRAVENOUS | Status: DC
  Administered 2015-01-26: 11:00:00 via INTRAVENOUS
  Filled 2015-01-26 (×3): qty 1000

## 2015-01-26 MED ORDER — FENTANYL CITRATE 0.05 MG/ML IJ SOLN
INTRAMUSCULAR | Status: DC | PRN
  Administered 2015-01-26 (×5): 25 ug via INTRAVENOUS

## 2015-01-26 MED ORDER — LIDOCAINE HCL (CARDIAC) 20 MG/ML IV SOLN
INTRAVENOUS | Status: DC | PRN
  Administered 2015-01-26: 40 mg via INTRAVENOUS

## 2015-01-26 MED ORDER — MELATONIN 5 MG PO CAPS: 1.0000 | ORAL_CAPSULE | Freq: Every day | ORAL | Status: DC

## 2015-01-26 MED ORDER — VITAMIN E 180 MG (400 UNIT) PO CAPS
400.0000 [IU] | ORAL_CAPSULE | Freq: Every day | ORAL | Status: DC
  Administered 2015-01-26: 400 [IU] via ORAL
  Filled 2015-01-26 (×2): qty 1

## 2015-01-26 MED ORDER — CHLORHEXIDINE GLUCONATE 4 % EX LIQD
1.0000 "application " | Freq: Once | CUTANEOUS | Status: DC
  Filled 2015-01-26: qty 15

## 2015-01-26 MED ORDER — CHLORHEXIDINE GLUCONATE 4 % EX LIQD: 1.0000 "application " | Freq: Once | CUTANEOUS | Status: DC

## 2015-01-26 MED ORDER — ONDANSETRON HCL 4 MG PO TABS: 4.0000 mg | ORAL_TABLET | Freq: Four times a day (QID) | ORAL | Status: DC | PRN

## 2015-01-26 MED ORDER — ASPIRIN EC 81 MG PO TBEC
81.0000 mg | DELAYED_RELEASE_TABLET | Freq: Every day | ORAL | Status: DC
  Administered 2015-01-26 – 2015-01-27 (×2): 81 mg via ORAL
  Filled 2015-01-26 (×3): qty 1

## 2015-01-26 SURGICAL SUPPLY — 40 items
APPLIER CLIP 9.375 MED OPEN (MISCELLANEOUS) ×3
APR CLP MED 9.3 20 MLT OPN (MISCELLANEOUS) ×1
BINDER BREAST LRG (GAUZE/BANDAGES/DRESSINGS) ×3 IMPLANT
BINDER BREAST XLRG (GAUZE/BANDAGES/DRESSINGS) IMPLANT
CANISTER SUCTION 2500CC (MISCELLANEOUS) ×3 IMPLANT
CHLORAPREP W/TINT 26ML (MISCELLANEOUS) ×3 IMPLANT
CLIP APPLIE 9.375 MED OPEN (MISCELLANEOUS) ×1 IMPLANT
COVER SURGICAL LIGHT HANDLE (MISCELLANEOUS) ×3 IMPLANT
DEVICE DISSECT PLASMABLAD 3.0S (MISCELLANEOUS) ×1 IMPLANT
DRAIN CHANNEL 19F RND (DRAIN) ×3 IMPLANT
DRAPE CHEST BREAST 15X10 FENES (DRAPES) ×3 IMPLANT
DRAPE UTILITY XL STRL (DRAPES) ×6 IMPLANT
ELECT BLADE 4.0 EZ CLEAN MEGAD (MISCELLANEOUS) ×3
ELECT CAUTERY BLADE 6.4 (BLADE) ×3 IMPLANT
ELECT REM PT RETURN 9FT ADLT (ELECTROSURGICAL) ×3
ELECTRODE BLDE 4.0 EZ CLN MEGD (MISCELLANEOUS) ×1 IMPLANT
ELECTRODE REM PT RTRN 9FT ADLT (ELECTROSURGICAL) ×1 IMPLANT
EVACUATOR SILICONE 100CC (DRAIN) ×3 IMPLANT
GLOVE EUDERMIC 7 POWDERFREE (GLOVE) ×3 IMPLANT
GOWN STRL REUS W/ TWL LRG LVL3 (GOWN DISPOSABLE) ×1 IMPLANT
GOWN STRL REUS W/ TWL XL LVL3 (GOWN DISPOSABLE) ×1 IMPLANT
GOWN STRL REUS W/TWL LRG LVL3 (GOWN DISPOSABLE) ×3
GOWN STRL REUS W/TWL XL LVL3 (GOWN DISPOSABLE) ×3
KIT BASIN OR (CUSTOM PROCEDURE TRAY) ×3 IMPLANT
KIT ROOM TURNOVER OR (KITS) ×3 IMPLANT
LIQUID BAND (GAUZE/BANDAGES/DRESSINGS) ×6 IMPLANT
NS IRRIG 1000ML POUR BTL (IV SOLUTION) ×3 IMPLANT
PACK GENERAL/GYN (CUSTOM PROCEDURE TRAY) ×3 IMPLANT
PAD ABD 8X10 STRL (GAUZE/BANDAGES/DRESSINGS) ×6 IMPLANT
PAD ARMBOARD 7.5X6 YLW CONV (MISCELLANEOUS) ×3 IMPLANT
PLASMABLADE 3.0S (MISCELLANEOUS) ×3
SPECIMEN JAR X LARGE (MISCELLANEOUS) ×3 IMPLANT
SPONGE GAUZE 4X4 12PLY STER LF (GAUZE/BANDAGES/DRESSINGS) ×3 IMPLANT
SUT ETHILON 3 0 FSL (SUTURE) ×6 IMPLANT
SUT MNCRL AB 4-0 PS2 18 (SUTURE) ×3 IMPLANT
SUT SILK 2 0 FS (SUTURE) ×3 IMPLANT
SUT VIC AB 3-0 SH 18 (SUTURE) ×6 IMPLANT
TAPE CLOTH SOFT 2X10 (GAUZE/BANDAGES/DRESSINGS) ×3 IMPLANT
TOWEL OR 17X24 6PK STRL BLUE (TOWEL DISPOSABLE) ×3 IMPLANT
TOWEL OR 17X26 10 PK STRL BLUE (TOWEL DISPOSABLE) ×3 IMPLANT

## 2015-01-26 NOTE — Op Note (Signed)
Patient Name:           [name]   Date of Surgery:        01/26/2015  Pre op Diagnosis:    Recurrent cancer right breast    Post op Diagnosis:    Recurrent cancer right breast  Procedure:                 Right total mastectomy  Surgeon:                     Edsel Petrin. Dalbert Batman, M.D., FACS  Assistant:                      OR staff  Operative Indications:   The patient is a 81 year old female who presents with  multifocal recurrent cancer in her right breast. She has completed neoadjuvant chemotherapy with reasonably good response. She gets chemotherapy weekly. She has a history of bilateral breast cancer which was receptor positive with bilateral lumpectomies. Recent evaluation revealed multiple rapidly growing erythematous nodules of the right breast skin. One of these was bleeding. Punch biopsy office showed triple negative breast cancer. Care plan was devised in coordination with Dr. Tressie Stalker and Dr. Isidore Moos. I placed a Port-A-Cath on December 11. She has received weekly carboplatin and Taxol and has responded. The bleeding is stopped. There are less numerous nodules. They are flatter and smaller. She feels fine otherwise and has not had any deterioration in her physical status.  She is strongly motivated to go ahead with the mastectomy.  We had a long talk about goals of treatment and the mastectomy procedure in general. I told her that our hope was that with chemotherapy followed by surgery followed by radiation therapy and chemotherapy that we could lower the chance of chest wall recurrence. I told her I could not guarantee that it would not recur. She understands this.  The port will be left in  Operative Findings:       There were numerous flat erythematous skin nodules of the right breast skin. These were most notable centrally, laterally, and inferolaterally. I had to devise a very wide transverse elliptical incision to get around all visible and palpable tumor.  We were able to close the skin with some tension, but the skin flaps were pink and viable without any signs of ischemia. I did not palpate any adenopathy in the right axilla.  Procedure in Detail:          Following the induction of general LMA anesthesia the patient's right chest wall and axilla were prepped and draped in a sterile fashion. Intravenous antibiotics were given. Surgical timeout was performed. Using a marking pin I devised a generous incision from the immediate parasternal area out to the midaxillary line to encompass all of the disease. Incision was made. Skin flaps were made superiorly to the infraclavicular area, medially to the parasternal area, inferiorly to the anterior rectus sheath and laterally to the latissimus dorsi muscle. The breast was then dissected off of the pectoralis major and minor muscles with electrocautery. The lateral skin margin was marked with silk suture. The specimen was sent to the lab with the history attached. Hemostasis was excellent and achieved  with electrocautery. Two 51 French Blake drains were placed, one up toward the axillary area and one across the skin flaps. These were brought out through the skin inferolaterally, sutured to the skin and  connected to suction bulbs. Incision was closed  in layers. Inner layer of interrupted 3-0 Vicryl subcutaneous and running 4-0 Monocryl subcuticular on the skin. Dermabond was placed. After the Dermabond dried a dry bandage was placed and the breast binder was placed. The patient tolerated the procedure well was taken to PACU in stable condition. EBL 50 mL. Counts correct. Complications none.     Edsel Petrin. Dalbert Batman, M.D., FACS General and Minimally Invasive Surgery Breast and Colorectal Surgery  01/26/2015 9:11 AM

## 2015-01-26 NOTE — Anesthesia Procedure Notes (Signed)
Procedure Name: LMA Insertion Date/Time: 01/26/2015 7:42 AM Performed by: [place] Pink Pre-anesthesia Checklist: Patient identified, Emergency Drugs available, Suction available, Patient being monitored and Timeout performed Patient Re-evaluated:Patient Re-evaluated prior to inductionOxygen Delivery Method: Circle system utilized Preoxygenation: Pre-oxygenation with 100% oxygen Intubation Type: IV induction Ventilation: Mask ventilation without difficulty LMA: LMA inserted LMA Size: 4.0 Number of attempts: 1 Placement Confirmation: positive ETCO2 and breath sounds checked- equal and bilateral Tube secured with: Tape Dental Injury: Teeth and Oropharynx as per pre-operative assessment

## 2015-01-26 NOTE — Anesthesia Preprocedure Evaluation (Addendum)
Anesthesia Evaluation  Patient identified by MRN, date of birth, ID band Patient awake    Reviewed: Allergy & Precautions, H&P , NPO status , Patient's Chart, lab work & pertinent test results  History of Anesthesia Complications (+) DIFFICULT AIRWAY  Airway Mallampati: II  TM Distance: >3 FB Neck ROM: Full    Dental no notable dental hx. (+) Teeth Intact, Dental Advisory Given   Pulmonary neg pulmonary ROS,  breath sounds clear to auscultation  Pulmonary exam normal       Cardiovascular negative cardio ROS  Rhythm:Regular Rate:Normal     Neuro/Psych CVA, No Residual Symptoms negative psych ROS   GI/Hepatic negative GI ROS, Neg liver ROS,   Endo/Other  negative endocrine ROS  Renal/GU negative Renal ROS  negative genitourinary   Musculoskeletal  (+) Arthritis -, Osteoarthritis,    Abdominal   Peds  Hematology negative hematology ROS (+)   Anesthesia Other Findings   Reproductive/Obstetrics negative OB ROS                            Anesthesia Physical Anesthesia Plan  ASA: II  Anesthesia Plan: General   Post-op Pain Management:    Induction: Intravenous  Airway Management Planned: LMA  Additional Equipment:   Intra-op Plan:   Post-operative Plan: Extubation in OR  Informed Consent: I have reviewed the patients History and Physical, chart, labs and discussed the procedure including the risks, benefits and alternatives for the proposed anesthesia with the patient or authorized representative who has indicated his/her understanding and acceptance.   Dental advisory given  Plan Discussed with: CRNA  Anesthesia Plan Comments:         Anesthesia Quick Evaluation

## 2015-01-26 NOTE — Interval H&P Note (Signed)
History and Physical Interval Note:  01/26/2015 7:02 AM  [name]  has presented today for surgery, with the diagnosis of right breast cancer  The goals and the various methods of treatment have been discussed with the patient and family. After consideration of risks, benefits and other options for treatment, the patient has consented to  Procedure(s): RIGHT TOTAL MASTECTOMY (Right) as a surgical intervention .  The patient's history has been reviewed, patient examined today , no change in status, stable for surgery.  I have reviewed the patient's chart and labs.  Questions were answered to the patient's satisfaction.     Adin Hector

## 2015-01-26 NOTE — Anesthesia Postprocedure Evaluation (Signed)
  Anesthesia Post-op Note  Patient: [name]  Procedure(s) Performed: Procedure(s): RIGHT TOTAL MASTECTOMY (Right)  Patient Location: PACU  Anesthesia Type:General  Level of Consciousness: awake and alert   Airway and Oxygen Therapy: Patient Spontanous Breathing  Post-op Pain: mild  Post-op Assessment: Post-op Vital signs reviewed, Patient's Cardiovascular Status Stable and Respiratory Function Stable  Post-op Vital Signs: Reviewed  Filed Vitals:   01/26/15 0945  BP: 159/60  Pulse: 75  Temp: 36.3 C  Resp: 12    Complications: No apparent anesthesia complications

## 2015-01-26 NOTE — Transfer of Care (Signed)
Immediate Anesthesia Transfer of Care Note  Patient: [name]  Procedure(s) Performed: Procedure(s): RIGHT TOTAL MASTECTOMY (Right)  Patient Location: PACU  Anesthesia Type:General  Level of Consciousness: awake, alert  and oriented  Airway & Oxygen Therapy: Patient Spontanous Breathing and Patient connected to nasal cannula oxygen  Post-op Assessment: Report given to RN and Post -op Vital signs reviewed and stable  Post vital signs: Reviewed and stable  Last Vitals:  Filed Vitals:   01/26/15 0630  BP: 179/50  Pulse: 77  Temp: 36.6 C  Resp: 18    Complications: No apparent anesthesia complications

## 2015-01-27 ENCOUNTER — Encounter (HOSPITAL_COMMUNITY): Payer: Self-pay | Admitting: General Surgery

## 2015-01-27 DIAGNOSIS — C50911 Malignant neoplasm of unspecified site of right female breast: Secondary | ICD-10-CM | POA: Diagnosis not present

## 2015-01-27 MED ORDER — HYDROCODONE-ACETAMINOPHEN 5-325 MG PO TABS: 1.0000 | ORAL_TABLET | Freq: Four times a day (QID) | ORAL | Status: DC | PRN

## 2015-01-27 NOTE — Discharge Instructions (Signed)
-  see above 

## 2015-01-27 NOTE — Discharge Summary (Addendum)
Patient ID: [name] [id number] 81 y.o. 1920/01/25  Admit date: 01/26/2015  Discharge date and time: 01/27/2015  Admitting Physician: Adin Hector  Discharge Physician: Adin Hector  Admission Diagnoses: right breast cancer  Discharge Diagnoses: Recurrent cancer right breast, Triple negative breast cancer phenotype. Status post neoadjuvant chemotherapy History of bilateral breast cancer  history of uterine cancer Family history of breast cancer History left hip fracture     Operations: Procedure(s): RIGHT TOTAL MASTECTOMY  Admission Condition: good  Discharged Condition: good  Indication for Admission:  The patient is a 81 year old female who presents with multifocal recurrent cancer in her right breast. She has completed neoadjuvant chemotherapy with reasonably good response. She gets chemotherapy weekly. She has a history of bilateral breast cancer which was receptor positive with bilateral lumpectomies. Recent evaluation revealed multiple rapidly growing erythematous nodules of the right breast skin. One of these was bleeding. Punch biopsy in the office showed triple negative breast cancer. Care plan and staging  was devised in coordination with Dr. Tressie Stalker and Dr. Isidore Moos. I placed a Port-A-Cath on December 11. She has received weekly carboplatin and Taxol and has partially responded. The bleeding is stopped. There are less numerous nodules. They are flatter and smaller. She feels fine otherwise and has not had any deterioration in her physical status. She is strongly motivated to go ahead with the mastectomy.  We had a long talk about goals of treatment and the mastectomy procedure in general. I told her that our hope was that with chemotherapy followed by surgery followed by radiation therapy and chemotherapy that we could lower the chance of chest wall recurrence. I told her I could not guarantee that it would not recur. She understands  this.  The port will be left in  Hospital Course: On the day of admission the patient was taken to the operating room and underwent a right total mastectomy.      There were numerous flat erythematous skin nodules of the right breast skin. These were most notable centrally, laterally, and inferolaterally. I had to devise a very wide transverse elliptical incision to get around all visible and palpable tumor. We were able to close the skin with some tension, but the skin flaps were pink and viable without any signs of ischemia. I did not palpate any adenopathy in the right axilla.     Postoperatively the patient did very well. Pain was well controlled. She was observed overnight. She became ambulatory with a walker. Tolerated diet. Voiding without difficulty. She was very alert and oriented and was strongly motivated to go home where she lives with her two  daughters. Examination on  the day of discharge revealed that the right mastectomy skin flaps for viable and healthy. No sign of skin necrosis. No retained hematoma. Both drains were functioning normally with low volume serosanguineous drainage. She was moving her right shoulder around fairly well. Lungs were clear.      Drain care was taught. Diet and activities were discussed. Long discussion about avoiding falls, use of her walker which she has at home. She understands this very well and assures me that she will be very careful. She will continue her usual medications. She was given a prescription for Norco to use sparingly for pain. She has an appointment to see me in the office on March 17 for wound and drain check.  Consults: None  Significant Diagnostic Studies: Surgical pathology, pending   Treatments: surgery: Right total mastectomy   Disposition: Home  Patient Instructions:    Medication List    TAKE these medications        aspirin 81 MG tablet  Take 81 mg by mouth daily.     aspirin 81 MG tablet  Take 81 mg by mouth  daily.     CALCIUM 600+D 600-400 MG-UNIT per tablet  Generic drug:  Calcium Carbonate-Vitamin D  Take 1 tablet by mouth daily.     Calcium Carb-Cholecalciferol 600-200 MG-UNIT Tabs  Take 1 tablet by mouth daily.     CENTRUM SILVER ULTRA WOMENS PO  Take 1 tablet by mouth daily.     Cranberry Fruit 475 MG Caps  Take 1 capsule by mouth daily.     Cranberry 475 MG Caps  Take 1 capsule by mouth daily.     HYDROcodone-acetaminophen 5-325 MG per tablet  Commonly known as:  NORCO  Take 1 tablet by mouth every 6 (six) hours as needed for moderate pain or severe pain.     HYDROcodone-acetaminophen 5-325 MG per tablet  Commonly known as:  NORCO  Take 1 tablet by mouth every 6 (six) hours as needed for moderate pain or severe pain.     Melatonin 3 MG Tabs  Take 3 mg by mouth at bedtime.     Melatonin 5 MG Caps  Take 1 capsule by mouth daily.     multivitamin capsule  Take 1 capsule by mouth daily.     PROBIOTIC DAILY PO  Take 1 capsule by mouth daily.     PROBIOTIC DAILY PO  Take 1 capsule by mouth daily.     Vitamin D3 400 UNITS Caps  Take 1 capsule by mouth daily.     Vitamin D3 5000 UNITS Caps  Take 1 capsule by mouth daily.     vitamin E 400 UNIT capsule  Take 400 Units by mouth daily.        Activity: Ambulate frequently with walker. Move right shoulder around frequently as instructed. Use incentive spirometer frequently to keep lungs clear. Diet: low fat, low cholesterol diet Wound Care: as directed  Follow-up:  With Dr. Halford Decamp 1 week.  Signed: Edsel Petrin. Dalbert Batman, M.D., FACS General and minimally invasive surgery Breast and Colorectal Surgery  01/27/2015, 6:32 AM

## 2015-01-27 NOTE — Progress Notes (Signed)
DC home with daughters, verbally understood DC instructions, no questions asked, verbally understood how to care for drains, supplies sent home with family

## 2015-01-28 NOTE — Progress Notes (Signed)
Quick Note:  Inform patient of Pathology report,.As we discussed.  hmi  ______

## 2015-03-04 ENCOUNTER — Other Ambulatory Visit (HOSPITAL_COMMUNITY): Payer: Self-pay | Admitting: Family Medicine

## 2015-03-04 ENCOUNTER — Other Ambulatory Visit (HOSPITAL_COMMUNITY): Payer: Self-pay | Admitting: Oncology

## 2015-03-04 DIAGNOSIS — C50111 Malignant neoplasm of central portion of right female breast: Secondary | ICD-10-CM

## 2015-03-05 ENCOUNTER — Ambulatory Visit (HOSPITAL_COMMUNITY)
Admission: RE | Admit: 2015-03-05 | Discharge: 2015-03-05 | Disposition: A | Discharge status: Home / Self Care | Payer: Medicare Other | Source: Ambulatory Visit | Attending: Family Medicine | Admitting: Family Medicine

## 2015-03-05 DIAGNOSIS — C50111 Malignant neoplasm of central portion of right female breast: Secondary | ICD-10-CM | POA: Insufficient documentation

## 2015-03-05 LAB — GLUCOSE, CAPILLARY: GLUCOSE-CAPILLARY: 86 mg/dL (ref 70–99)

## 2015-03-05 MED ORDER — FLUDEOXYGLUCOSE F - 18 (FDG) INJECTION
6.4000 | Freq: Once | INTRAVENOUS | Status: AC | PRN
  Administered 2015-03-05: 6.4 via INTRAVENOUS

## 2015-03-06 ENCOUNTER — Encounter: Payer: Self-pay | Admitting: Radiation Oncology

## 2015-03-10 NOTE — Progress Notes (Addendum)
Radiation Oncology         [phone] ________________________________  Name: SHANNYN JANKOWIAK MRN: [id number]  Date: 03/11/2015  DOB: [birth year]  Follow-Up Visit Note  Outpatient  CC: Geoffery Lyons, MD  Fanny Skates, MD  Diagnosis: Triple Negative Right breast cancer [id number] Stage III     ICD-9-CM ICD-10-CM   1. Cancer of central portion of breast 174.1 C50.119     Narrative:  The patient returns today for follow-up.  Consult was done in [place].  Staging PET on 12-8 was negative for nodal or distant disease.  The right breast had multiple lesions. She underwent neoadjuvant chemotherapy in [place] with Dr. Tressie Stalker (weekly carbo taxol) with a good partial clinical response.  She underwent surgery in the form of mastectomy on 01/26/15.  Pathology revealed pT2Nx disease with negative margins and dermal lymphatic involvement.  Postoperatively, she developed recurrent nodules over the right chest wall while healing. PET scan on 4/14 revealed activity at the hepatic flexure suspicious for colon cancer.  There was activity along the mastectomy site that was non specific.  No other signs of disease.                  PRIOR HISTORY OF BILATERAL RADIATION FOR BREAST CANCERS (right in ~1989, left in 1996). She cannot remember the hospital, but it was in [facility] over about 5-6 weeks.  Her breast cancers were node negative and no chemotherapy was given after the lumpectomies. She believes only her breasts were treated.   ALLERGIES:  is allergic to penicillins; penicillins; and tape.  Meds: Current Outpatient Prescriptions  Medication Sig Dispense Refill  . aspirin 81 MG tablet Take 81 mg by mouth daily.    . Cholecalciferol (VITAMIN D3) 5000 UNITS CAPS Take 1 capsule by mouth daily.    . Cranberry 475 MG CAPS Take 1 capsule by mouth daily.    . Multiple Vitamins-Minerals (CENTRUM SILVER ULTRA WOMENS PO) Take 1 tablet by mouth daily.    . Probiotic Product (PROBIOTIC DAILY PO) Take  1 capsule by mouth daily.    Marland Kitchen zolpidem (AMBIEN) 5 MG tablet Take 1 tablet by mouth.    Marland Kitchen HYDROcodone-acetaminophen (NORCO) 5-325 MG per tablet Take 1 tablet by mouth every 6 (six) hours as needed for moderate pain or severe pain. (Patient not taking: Reported on 03/11/2015) 30 tablet 0  . lidocaine-prilocaine (EMLA) cream Apply topically.     No current facility-administered medications for this encounter.    Physical Findings: The patient is in no acute distress. Patient is alert and oriented.  weight is 119 lb 12.8 oz (54.341 kg). Her oral temperature is 98.1 F (36.7 C). Her blood pressure is 178/82 and her pulse is 84. Her respiration is 18 and oxygen saturation is 98%. .    Right chest wall notable for recurrent dermal lesions (tumors) lateral to and medial to mastectomy scar. Photos filed by nursing. No neck or axillary adenopathy  ECOG 1  Lab Findings: Lab Results  Component Value Date   WBC 5.1 01/26/2015   HGB 11.0* 01/26/2015   HCT 32.8* 01/26/2015   MCV 90.9 01/26/2015   PLT 201 01/26/2015       Radiographic Findings: Nm Pet Image Restag (ps) Skull Base To Thigh  03/05/2015   CLINICAL DATA:  Subsequent treatment strategy for right-sided breast cancer.  EXAM: NUCLEAR MEDICINE PET SKULL BASE TO THIGH  TECHNIQUE: 6.4 mCi F-18 FDG was injected intravenously. Full-ring PET imaging was performed from the skull  base to thigh after the radiotracer. CT data was obtained and used for attenuation correction and anatomic localization.  FASTING BLOOD GLUCOSE:  Value: 86 mg/dl  COMPARISON:  10/28/2014  FINDINGS: NECK  No hypermetabolic lymph nodes in the neck.  CHEST  Interval right mastectomy with expected activity along the mastectomy noted. Mild, flat skin thickening along the lateral chest wall lateral to the breasts has a maximum standard uptake value of 5.2.  ABDOMEN/PELVIS  Abnormal circumferential wall thickening in a short segment of the hepatic flexure the colon just below the  gallbladder, with maximum standard uptake value 11.4, concerning for colon cancer. The activity was not nearly as high on the prior exam and more compatible with physiologic activity previously, but today the appearance is concerning for malignancy.  2 mm left kidney lower pole nonobstructive calculus. Aortoiliac atherosclerotic vascular disease.  SKELETON  No focal hypermetabolic activity to suggest skeletal metastasis. Degenerative activity associated with thoracolumbar scoliosis, left proximal humeral implant, and left femoral hardware.  IMPRESSION: 1. Suspicion for colon cancer at the hepatic flexure of the colon. Colonoscopy recommended. 2. Postoperative activity along the mastectomy site. No definite recurrence. 3. Other findings include atherosclerosis, nonobstructive left nephrolithiasis, and thoracolumbar scoliosis.   Electronically Signed   By: Van Clines M.D.   On: 03/05/2015 15:09    Impression/Plan:  Patient discussed extensively with tumor board, Dr Dalbert Batman, and Dr Tressie Stalker  The patient, her family and I discussed adjuvant radiotherapy today.  I recommend 6 weeks of radiotherapy to the right chest wall and regional nodes in order to decrease risk of local regional recurrence; she anticipates concurrent capecitabine with Dr Ivin Poot.  The risks, benefits and side effects of this treatment were discussed in detail.  She understands that radiotherapy is associated with skin irritation and fatigue in the acute setting. Late effects can include cosmetic changes and rare injury to internal organs.   She is enthusiastic about proceeding with treatment. A consent form was signed today. Proceed with Radiotherapy planning today; start date of Tues, 4/26 (I would like to start RT relatively quickly even if her chemotherapy isn't available by then.)  ADDENDUM: discussed colon lesion on PET. No bowel complaints.  Will not address this currently, given her more urgent issues.  I spent 25 minutes  minutes face to face with the patient and more than 50% of that time was spent in counseling and/or coordination of care. _____________________________________   Eppie Gibson, MD

## 2015-03-11 ENCOUNTER — Ambulatory Visit
Admission: RE | Admit: 2015-03-11 | Discharge: 2015-03-11 | Disposition: A | Discharge status: Home / Self Care | Payer: Medicare Other | Source: Ambulatory Visit | Attending: Radiation Oncology | Admitting: Radiation Oncology

## 2015-03-11 ENCOUNTER — Encounter: Payer: Self-pay | Admitting: Radiation Oncology

## 2015-03-11 VITALS — BP 178/82 | HR 84 | Temp 98.1°F | Resp 18 | Wt 119.8 lb

## 2015-03-11 DIAGNOSIS — C50119 Malignant neoplasm of central portion of unspecified female breast: Secondary | ICD-10-CM

## 2015-03-11 DIAGNOSIS — C50919 Malignant neoplasm of unspecified site of unspecified female breast: Secondary | ICD-10-CM

## 2015-03-11 HISTORY — DX: Personal history of antineoplastic chemotherapy: Z92.21

## 2015-03-11 NOTE — Progress Notes (Signed)
ICD-9-CM ICD-10-CM   1. Cancer of central portion of breast 174.1 C50.119     COMPLEX SIMULATION AND TREATMENT PLANNING AND SPEC TX PROCEDURE  NARRATIVE:  The patient was brought to the [facility].  Identity was confirmed.  All relevant records and images related to the planned course of therapy were reviewed.  The patient freely provided informed written consent to proceed with treatment after reviewing the details related to the planned course of therapy. The consent form was witnessed and verified by the simulation staff.    Then, the patient was set-up in a stable reproducible  supine position for radiation therapy.  CT images were obtained.  Surface markings were placed.  The CT images were loaded into the planning software.    TREATMENT PLANNING NOTE: Treatment planning then occurred.  The radiation prescription was entered and confirmed.    A total of 5 medically necessary complex treatment devices were fabricated and supervised by me: Vaclock for arm, and 4 fields with MLCs to block lung, cord, bowel, liver, cord, esophagus, heart. I have requested : 3D Simulation  I have requested a DVH of the following structures: lung, cord, esophagus, heart.  Bowel liver.  The patient will receive 46 Gy in 23 fractions to the R chest wall and regional nodes, using 4 photon fields, and later a 14 Gy boost in 7 fractions to her scar /gross disease with electrons  Special Treatment Procedure Note: The patient will be receiving chemotherapy concurrently. Chemotherapy heightens the risk of side effects. I have considered this during the patient's treatment planning process and will monitor the patient accordingly for side effects on a weekly basis. Concurrent chemotherapy increases the complexity of this patient's treatment and therefore this constitutes a special treatment procedure.   The patient received prior radiotherapy in to her current fields. There could be some overlap of  radiation dose.  Prior regional radiotherapy increases the risk of side effects from treatment. I have considered this in the treatment planning process and have aimed to minimize tissue overlap.  This increases the complexity of this patient's treatment and therefore this constitutes a special treatment procedure.  Optical Surface Tracking Plan:  Since intensity modulated radiotherapy (IMRT) and 3D conformal radiation treatment methods are predicated on accurate and precise positioning for treatment, intrafraction motion monitoring is medically necessary to ensure accurate and safe treatment delivery. The ability to quantify intrafraction motion without excessive ionizing radiation dose can only be performed with optical surface tracking. Accordingly, surface imaging offers the opportunity to obtain 3D measurements of patient position throughout IMRT and 3D treatments without excessive radiation exposure. I am ordering optical surface tracking for this patient's upcoming course of radiotherapy.  ________________________________   Reference:  Ursula Alert, J, et al. Surface imaging-based analysis of intrafraction motion for breast radiotherapy patients.Journal of [place], n. 6, nov. 2014. ISSN [id number].  Available at: <http://www.jacmp.org/index.php/jacmp/article/view/4957>.   -----------------------------------  Eppie Gibson, MD

## 2015-03-11 NOTE — Progress Notes (Addendum)
Patient presented accompanied by her daughter and son in law for a consultation with Dr. Isidore Moos to discuss radiation to right chest wall for recurrence following mastectomy. Patient lives with her daughter and son in [place]. Patient endured right mastectomy on March 7th and noted first lesion indicative of reoccurrence on April 6. Numerous small fluid filled lesions noted right chest wall at each end of well approximated mastectomy incision. No fever or drainage noted from incision or lesions. No lymphedema of either upper extremity noted. Full ROM of right arm but, limited ROM of left arm demonstrated. Limited ROM of left arm related to effects of prior shoulder following break. Denies headache, dizziness, nausea, vomiting or diarrhea. BP elevated. Denies pain. Denies distress. Patient reports that she has not received any chemotherapy since prior to her mastectomy in March. Requesting clarification as to if she will be required to take a chemotherapy pill. Patient hx positive for radiation therapy.

## 2015-03-11 NOTE — Progress Notes (Signed)
See physician progress note for additional details.

## 2015-03-13 DIAGNOSIS — C50119 Malignant neoplasm of central portion of unspecified female breast: Secondary | ICD-10-CM | POA: Diagnosis not present

## 2015-03-16 ENCOUNTER — Ambulatory Visit
Admission: RE | Admit: 2015-03-16 | Discharge: 2015-03-16 | Disposition: A | Discharge status: Home / Self Care | Payer: Medicare Other | Source: Ambulatory Visit | Attending: Radiation Oncology | Admitting: Radiation Oncology

## 2015-03-16 ENCOUNTER — Ambulatory Visit: Payer: Medicare Other | Admitting: Radiation Oncology

## 2015-03-16 DIAGNOSIS — C50119 Malignant neoplasm of central portion of unspecified female breast: Secondary | ICD-10-CM | POA: Diagnosis not present

## 2015-03-17 ENCOUNTER — Ambulatory Visit
Admission: RE | Admit: 2015-03-17 | Discharge: 2015-03-17 | Disposition: A | Discharge status: Home / Self Care | Payer: Medicare Other | Source: Ambulatory Visit | Attending: Radiation Oncology | Admitting: Radiation Oncology

## 2015-03-17 DIAGNOSIS — C50119 Malignant neoplasm of central portion of unspecified female breast: Secondary | ICD-10-CM | POA: Diagnosis not present

## 2015-03-18 ENCOUNTER — Ambulatory Visit
Admission: RE | Admit: 2015-03-18 | Discharge: 2015-03-18 | Disposition: A | Discharge status: Home / Self Care | Payer: Medicare Other | Source: Ambulatory Visit | Attending: Radiation Oncology | Admitting: Radiation Oncology

## 2015-03-18 ENCOUNTER — Telehealth: Payer: Self-pay | Admitting: Physical Therapy

## 2015-03-18 ENCOUNTER — Other Ambulatory Visit: Payer: Self-pay | Admitting: Radiation Oncology

## 2015-03-18 DIAGNOSIS — C50119 Malignant neoplasm of central portion of unspecified female breast: Secondary | ICD-10-CM | POA: Diagnosis not present

## 2015-03-18 NOTE — Telephone Encounter (Signed)
Talked to pt's daughter, regarding referral to Lymphedema clinic, pt does not want to come, [facility] notified

## 2015-03-19 ENCOUNTER — Telehealth: Payer: Self-pay | Admitting: *Deleted

## 2015-03-19 ENCOUNTER — Ambulatory Visit: Payer: Medicare Other

## 2015-03-19 ENCOUNTER — Ambulatory Visit
Admission: RE | Admit: 2015-03-19 | Discharge: 2015-03-19 | Disposition: A | Discharge status: Home / Self Care | Payer: Medicare Other | Source: Ambulatory Visit | Attending: Radiation Oncology | Admitting: Radiation Oncology

## 2015-03-19 DIAGNOSIS — C50119 Malignant neoplasm of central portion of unspecified female breast: Secondary | ICD-10-CM | POA: Diagnosis not present

## 2015-03-19 NOTE — Telephone Encounter (Signed)
Called patient to inform of weekly labs, for 6 wks. starting tomorrow, spoke with patient and she is aware of these labs

## 2015-03-20 ENCOUNTER — Ambulatory Visit
Admission: RE | Admit: 2015-03-20 | Discharge: 2015-03-20 | Disposition: A | Discharge status: Home / Self Care | Payer: Medicare Other | Source: Ambulatory Visit | Attending: Radiation Oncology | Admitting: Radiation Oncology

## 2015-03-20 DIAGNOSIS — C50119 Malignant neoplasm of central portion of unspecified female breast: Secondary | ICD-10-CM | POA: Diagnosis present

## 2015-03-20 LAB — CBC WITH DIFFERENTIAL/PLATELET
BASO%: 1 % (ref 0.0–2.0)
Basophils Absolute: 0 10*3/uL (ref 0.0–0.1)
EOS%: 3.4 % (ref 0.0–7.0)
Eosinophils Absolute: 0.2 10*3/uL (ref 0.0–0.5)
HCT: 32.5 % — ABNORMAL LOW (ref 34.8–46.6)
HGB: 10.5 g/dL — ABNORMAL LOW (ref 11.6–15.9)
LYMPH%: 24.3 % (ref 14.0–49.7)
MCH: 29.8 pg (ref 25.1–34.0)
MCHC: 32.3 g/dL (ref 31.5–36.0)
MCV: 92.2 fL (ref 79.5–101.0)
MONO#: 0.6 10*3/uL (ref 0.1–0.9)
MONO%: 13.1 % (ref 0.0–14.0)
NEUT#: 2.7 10*3/uL (ref 1.5–6.5)
NEUT%: 58.2 % (ref 38.4–76.8)
PLATELETS: 265 10*3/uL (ref 145–400)
RBC: 3.52 10*6/uL — ABNORMAL LOW (ref 3.70–5.45)
RDW: 14.3 % (ref 11.2–14.5)
WBC: 4.7 10*3/uL (ref 3.9–10.3)
lymph#: 1.1 10*3/uL (ref 0.9–3.3)

## 2015-03-23 ENCOUNTER — Ambulatory Visit
Admission: RE | Admit: 2015-03-23 | Discharge: 2015-03-23 | Disposition: A | Discharge status: Home / Self Care | Payer: Medicare Other | Source: Ambulatory Visit | Attending: Radiation Oncology | Admitting: Radiation Oncology

## 2015-03-23 ENCOUNTER — Encounter: Payer: Self-pay | Admitting: Radiation Oncology

## 2015-03-23 VITALS — BP 156/77 | HR 80 | Temp 98.3°F | Resp 12 | Wt 119.1 lb

## 2015-03-23 DIAGNOSIS — C50119 Malignant neoplasm of central portion of unspecified female breast: Secondary | ICD-10-CM | POA: Diagnosis not present

## 2015-03-23 NOTE — Progress Notes (Signed)
RADIATION TREATMENT  SKIN CARE-BREAST    RECOMMENDATIONS: ? Use unscented soap (Dove) ? When showering it is fine for water to touch the area, but please avoid direct spray on the treatment field if skin becomes irritated.  Also, wash inside and around the marked area ? When drying gently blot the area  ? PLEASE DO NOT APPLY ANY OTHER CREAMS, LOTIONS, POWDERS, PERFUMES, OILS OR ALCOHOL PRODUCTS (OTHER THAN WHAT IS GIVEN TO YOU BY THE RADIATION TEAM) TO THE TREATMENT AREA DURING RADIATION THERAPY   SKIN CARE: ? Moisturizer o You will be given (Radiaplex Gel) to use. Apply twice daily, once after treatment and then again prior to bedtime o Your Radiation Oncologist may suggest other skin care products as needed  DEODORANT:  ? Nursing will provide deodorant (ALRA) to be used during your radiation treatment  Deodorant Alternatives: ? A combination of equal amounts of baking soda and corn starch.  Mix together and powder puff on ? Toms of [place]-available at most [employer] ([employer], [employer]) ? Thai Crystal Deodorant Mist-100% Natural Mineral Salts and Purified water ([employer], [employer])   PLEASE DO NOT APPLY THE RADIAPLEX GEL OR ALRA DEODORANT WITHIN 4 HOURS PRIOR TO RADIATION TREATMENT  

## 2015-03-23 NOTE — Progress Notes (Signed)
She is currently in no pain.  Pt complains of fatigue and loss of sleep-taking Ambien to help.  Pt right breast- positive for multiple blistered sites along incision line. Noted an area of erythema over left posterior shoulder/back.  Pt is wondering if it is from the treatment table. Pt denies edema. Pt education performed today. BP 156/77 mmHg  Pulse 80  Temp(Src) 98.3 F (36.8 C) (Oral)  Resp 12  Wt 119 lb 1.6 oz (54.023 kg)  SpO2 100%

## 2015-03-23 NOTE — Progress Notes (Addendum)
   Weekly Management Note:  Outpatient  [facility]  Current Dose:  10 Gy  (5 fractions)    Narrative:  The patient presents for routine under treatment assessment.  CBCT/MVCT images/Port film x-rays were reviewed.  The chart was checked.In good spirits, ambulatory, NAD. No complaints. Takes Xeloda once daily. No nausea or diarrhea. One of the Lateral lesions is  crusting  Physical Findings:  Wt Readings from Last 3 Encounters:  01/22/15 119 lb 11.2 oz (54.296 kg)  10/31/14 114 lb (51.71 kg)  04/06/14 120 lb (54.432 kg)    NAD, ambulatory. Nodules over right chest relatively stable. One of the Lateral lesions is  crusting No skin erythema r/t RT  Vitals with Age-Percentiles 12/30/5282  Length   Systolic 132  Diastolic 77  Pulse 80  Respiration 12  Weight 54.023 kg  BMI   VISIT REPORT    CBC    Component Value Date/Time   WBC 4.7 03/20/2015 1550   WBC 5.1 01/26/2015 1153   RBC 3.52* 03/20/2015 1550   RBC 3.61* 01/26/2015 1153   HGB 10.5* 03/20/2015 1550   HGB 11.0* 01/26/2015 1153   HCT 32.5* 03/20/2015 1550   HCT 32.8* 01/26/2015 1153   PLT 265 03/20/2015 1550   PLT 201 01/26/2015 1153   MCV 92.2 03/20/2015 1550   MCV 90.9 01/26/2015 1153   MCH 29.8 03/20/2015 1550   MCH 30.5 01/26/2015 1153   MCHC 32.3 03/20/2015 1550   MCHC 33.5 01/26/2015 1153   RDW 14.3 03/20/2015 1550   RDW 16.9* 01/26/2015 1153   LYMPHSABS 1.1 03/20/2015 1550   LYMPHSABS 2.2 03/07/2012 1202   MONOABS 0.6 03/20/2015 1550   MONOABS 0.4 03/07/2012 1202   EOSABS 0.2 03/20/2015 1550   EOSABS 0.2 03/07/2012 1202   BASOSABS 0.0 03/20/2015 1550   BASOSABS 0.0 03/07/2012 1202     CMP     Component Value Date/Time   NA 139 01/22/2015 1448   K 4.2 01/22/2015 1448   CL 108 01/22/2015 1448   CO2 26 01/22/2015 1448   GLUCOSE 139* 01/22/2015 1448   BUN 17 01/22/2015 1448   CREATININE 0.46* 01/26/2015 1153   CALCIUM 9.0 01/22/2015 1448   PROT 5.7* 03/10/2014 0400   ALBUMIN 2.8*  03/10/2014 0400   AST 16 03/10/2014 0400   ALT 17 03/10/2014 0400   ALKPHOS 122* 03/10/2014 0400   BILITOT 0.4 03/10/2014 0400   GFRNONAA 84* 01/26/2015 1153   GFRAA >90 01/26/2015 1153     Impression:  The patient is tolerating radiotherapy.   Plan:  Continue radiotherapy as planned. Lab results shared with Dr Tressie Stalker.  -----------------------------------  Eppie Gibson, MD

## 2015-03-23 NOTE — Addendum Note (Signed)
Encounter addended by: Jenene Slicker, RN on: 03/23/2015  6:01 PM<BR>     Documentation filed: Notes Section

## 2015-03-23 NOTE — Progress Notes (Signed)
Pt here for patient teaching.  Pt given Radiation and You booklet, skin care instructions, Alra deodorant and Radiaplex gel. Reviewed areas of pertinence such as fatigue, hair loss, skin changes, breast tenderness and breast swelling . Pt able to give teach back of to pat skin and use unscented/gentle soap,apply Radiaplex bid, avoid applying anything to skin within 4 hours of treatment, avoid wearing an under wire bra and to use an electric razor if they must shave. Pt demonstrated understanding of information given and will contact nursing with any questions or concerns.        

## 2015-03-24 ENCOUNTER — Ambulatory Visit
Admission: RE | Admit: 2015-03-24 | Discharge: 2015-03-24 | Disposition: A | Discharge status: Home / Self Care | Payer: Medicare Other | Source: Ambulatory Visit | Attending: Radiation Oncology | Admitting: Radiation Oncology

## 2015-03-24 ENCOUNTER — Inpatient Hospital Stay: Admission: RE | Admit: 2015-03-24 | Payer: Medicare Other | Source: Ambulatory Visit | Admitting: Radiation Oncology

## 2015-03-24 ENCOUNTER — Encounter: Payer: Self-pay | Admitting: Radiation Oncology

## 2015-03-24 DIAGNOSIS — C50119 Malignant neoplasm of central portion of unspecified female breast: Secondary | ICD-10-CM | POA: Diagnosis not present

## 2015-03-25 ENCOUNTER — Ambulatory Visit
Admission: RE | Admit: 2015-03-25 | Discharge: 2015-03-25 | Disposition: A | Discharge status: Home / Self Care | Payer: Medicare Other | Source: Ambulatory Visit | Attending: Radiation Oncology | Admitting: Radiation Oncology

## 2015-03-25 DIAGNOSIS — C50119 Malignant neoplasm of central portion of unspecified female breast: Secondary | ICD-10-CM | POA: Diagnosis not present

## 2015-03-26 ENCOUNTER — Ambulatory Visit
Admission: RE | Admit: 2015-03-26 | Discharge: 2015-03-26 | Disposition: A | Discharge status: Home / Self Care | Payer: Medicare Other | Source: Ambulatory Visit | Attending: Radiation Oncology | Admitting: Radiation Oncology

## 2015-03-26 DIAGNOSIS — C50119 Malignant neoplasm of central portion of unspecified female breast: Secondary | ICD-10-CM | POA: Diagnosis not present

## 2015-03-27 ENCOUNTER — Ambulatory Visit
Admission: RE | Admit: 2015-03-27 | Discharge: 2015-03-27 | Disposition: A | Discharge status: Home / Self Care | Payer: Medicare Other | Source: Ambulatory Visit | Attending: Radiation Oncology | Admitting: Radiation Oncology

## 2015-03-27 DIAGNOSIS — C50119 Malignant neoplasm of central portion of unspecified female breast: Secondary | ICD-10-CM

## 2015-03-27 LAB — CBC WITH DIFFERENTIAL/PLATELET
BASO%: 0.5 % (ref 0.0–2.0)
Basophils Absolute: 0 10*3/uL (ref 0.0–0.1)
EOS ABS: 0.2 10*3/uL (ref 0.0–0.5)
EOS%: 4.3 % (ref 0.0–7.0)
HCT: 34.8 % (ref 34.8–46.6)
HGB: 11.5 g/dL — ABNORMAL LOW (ref 11.6–15.9)
LYMPH#: 1 10*3/uL (ref 0.9–3.3)
LYMPH%: 23.4 % (ref 14.0–49.7)
MCH: 30.4 pg (ref 25.1–34.0)
MCHC: 33 g/dL (ref 31.5–36.0)
MCV: 92.1 fL (ref 79.5–101.0)
MONO#: 0.6 10*3/uL (ref 0.1–0.9)
MONO%: 13.3 % (ref 0.0–14.0)
NEUT#: 2.4 10*3/uL (ref 1.5–6.5)
NEUT%: 58.5 % (ref 38.4–76.8)
Platelets: 141 10*3/uL — ABNORMAL LOW (ref 145–400)
RBC: 3.78 10*6/uL (ref 3.70–5.45)
RDW: 14.5 % (ref 11.2–14.5)
WBC: 4.1 10*3/uL (ref 3.9–10.3)
nRBC: 0 % (ref 0–0)

## 2015-03-30 ENCOUNTER — Ambulatory Visit
Admission: RE | Admit: 2015-03-30 | Discharge: 2015-03-30 | Disposition: A | Discharge status: Home / Self Care | Payer: Medicare Other | Source: Ambulatory Visit | Attending: Radiation Oncology | Admitting: Radiation Oncology

## 2015-03-30 VITALS — BP 121/52 | HR 80 | Temp 98.3°F | Resp 12 | Wt 117.5 lb

## 2015-03-30 DIAGNOSIS — C50119 Malignant neoplasm of central portion of unspecified female breast: Secondary | ICD-10-CM | POA: Diagnosis not present

## 2015-03-30 NOTE — Progress Notes (Signed)
She is currently in no pain.   Pt complains of fatigue .  Pt right breast- positive for Pruritus and erythema.  Pt denies edema. Pt continues to apply Radiaplex as directed. BP 121/52 mmHg  Pulse 80  Temp(Src) 98.3 F (36.8 C) (Oral)  Resp 12  Wt 117 lb 8 oz (53.298 kg)  SpO2 99%

## 2015-03-30 NOTE — Progress Notes (Signed)
   Weekly Management Note:  Outpatient      ICD-9-CM ICD-10-CM   1. Cancer of central portion of breast 174.1 C50.119      Current Dose:  20 Gy (with Xeloda) Total anticipated dose: 60Gy    Narrative:  The patient presents for routine under treatment assessment.  CBCT/MVCT images/Port film x-rays were reviewed.  The chart was checked.In good spirits, ambulatory, NAD. No complaints.  Feels that nodules are regressing slightly  Physical Findings:  Wt Readings from Last 3 Encounters:  03/30/15 117 lb 8 oz (53.298 kg)  01/22/15 119 lb 11.2 oz (54.296 kg)  10/31/14 114 lb (51.71 kg)    NAD, ambulatory. Nodules over right chestwall slightly less erythematous, possibly slight shrinkage. One of the Lateral lesions is  still crusting Mild erythema in treatment fields   Filed Vitals:   03/30/15 1425  BP: 121/52  Pulse: 80  Temp: 98.3 F (36.8 C)  Resp: 12     CBC    Component Value Date/Time   WBC 4.1 03/27/2015 1415   WBC 5.1 01/26/2015 1153   RBC 3.78 03/27/2015 1415   RBC 3.61* 01/26/2015 1153   HGB 11.5* 03/27/2015 1415   HGB 11.0* 01/26/2015 1153   HCT 34.8 03/27/2015 1415   HCT 32.8* 01/26/2015 1153   PLT 141* 03/27/2015 1415   PLT 201 01/26/2015 1153   MCV 92.1 03/27/2015 1415   MCV 90.9 01/26/2015 1153   MCH 30.4 03/27/2015 1415   MCH 30.5 01/26/2015 1153   MCHC 33.0 03/27/2015 1415   MCHC 33.5 01/26/2015 1153   RDW 14.5 03/27/2015 1415   RDW 16.9* 01/26/2015 1153   LYMPHSABS 1.0 03/27/2015 1415   LYMPHSABS 2.2 03/07/2012 1202   MONOABS 0.6 03/27/2015 1415   MONOABS 0.4 03/07/2012 1202   EOSABS 0.2 03/27/2015 1415   EOSABS 0.2 03/07/2012 1202   BASOSABS 0.0 03/27/2015 1415   BASOSABS 0.0 03/07/2012 1202     CMP     Component Value Date/Time   NA 139 01/22/2015 1448   K 4.2 01/22/2015 1448   CL 108 01/22/2015 1448   CO2 26 01/22/2015 1448   GLUCOSE 139* 01/22/2015 1448   BUN 17 01/22/2015 1448   CREATININE 0.46* 01/26/2015 1153   CALCIUM 9.0  01/22/2015 1448   PROT 5.7* 03/10/2014 0400   ALBUMIN 2.8* 03/10/2014 0400   AST 16 03/10/2014 0400   ALT 17 03/10/2014 0400   ALKPHOS 122* 03/10/2014 0400   BILITOT 0.4 03/10/2014 0400   GFRNONAA 84* 01/26/2015 1153   GFRAA >90 01/26/2015 1153     Impression:  The patient is tolerating radiotherapy.   Plan:  Continue radiotherapy as planned with xeloda. Lab results CC'd to Dr Tressie Stalker.  -----------------------------------  Eppie Gibson, MD

## 2015-03-31 ENCOUNTER — Ambulatory Visit
Admission: RE | Admit: 2015-03-31 | Discharge: 2015-03-31 | Disposition: A | Discharge status: Home / Self Care | Payer: Medicare Other | Source: Ambulatory Visit | Attending: Radiation Oncology | Admitting: Radiation Oncology

## 2015-03-31 DIAGNOSIS — C50119 Malignant neoplasm of central portion of unspecified female breast: Secondary | ICD-10-CM | POA: Diagnosis not present

## 2015-04-01 ENCOUNTER — Ambulatory Visit
Admission: RE | Admit: 2015-04-01 | Discharge: 2015-04-01 | Disposition: A | Discharge status: Home / Self Care | Payer: Medicare Other | Source: Ambulatory Visit | Attending: Radiation Oncology | Admitting: Radiation Oncology

## 2015-04-01 DIAGNOSIS — C50119 Malignant neoplasm of central portion of unspecified female breast: Secondary | ICD-10-CM | POA: Diagnosis not present

## 2015-04-02 ENCOUNTER — Ambulatory Visit
Admission: RE | Admit: 2015-04-02 | Discharge: 2015-04-02 | Disposition: A | Discharge status: Home / Self Care | Payer: Medicare Other | Source: Ambulatory Visit | Attending: Radiation Oncology | Admitting: Radiation Oncology

## 2015-04-02 DIAGNOSIS — C50119 Malignant neoplasm of central portion of unspecified female breast: Secondary | ICD-10-CM | POA: Diagnosis not present

## 2015-04-03 ENCOUNTER — Ambulatory Visit
Admission: RE | Admit: 2015-04-03 | Discharge: 2015-04-03 | Disposition: A | Discharge status: Home / Self Care | Payer: Medicare Other | Source: Ambulatory Visit | Attending: Radiation Oncology | Admitting: Radiation Oncology

## 2015-04-03 ENCOUNTER — Encounter: Payer: Self-pay | Admitting: Radiation Oncology

## 2015-04-03 ENCOUNTER — Ambulatory Visit: Admission: RE | Admit: 2015-04-03 | Payer: Medicare Other | Source: Ambulatory Visit

## 2015-04-03 DIAGNOSIS — C50119 Malignant neoplasm of central portion of unspecified female breast: Secondary | ICD-10-CM | POA: Diagnosis not present

## 2015-04-06 ENCOUNTER — Ambulatory Visit
Admission: RE | Admit: 2015-04-06 | Discharge: 2015-04-06 | Disposition: A | Discharge status: Home / Self Care | Payer: Medicare Other | Source: Ambulatory Visit | Attending: Radiation Oncology | Admitting: Radiation Oncology

## 2015-04-06 ENCOUNTER — Ambulatory Visit: Payer: Medicare Other | Admitting: Radiation Oncology

## 2015-04-06 VITALS — BP 127/77 | HR 78 | Temp 98.2°F | Resp 12 | Wt 117.3 lb

## 2015-04-06 DIAGNOSIS — C50119 Malignant neoplasm of central portion of unspecified female breast: Secondary | ICD-10-CM | POA: Diagnosis not present

## 2015-04-06 DIAGNOSIS — C50911 Malignant neoplasm of unspecified site of right female breast: Secondary | ICD-10-CM

## 2015-04-06 NOTE — Progress Notes (Addendum)
Weekly Management Note:  Site: Right chest wall and regional lymph nodes Current Dose:  3000  cGy Projected Dose: 4400  cGy followed by a boost  Narrative: The patient is seen today for routine under treatment assessment. CBCT/MVCT images/port films were reviewed. The chart was reviewed.   She is without complaints today.  She denies pain.  She uses Radioplex gel.  She tells me that she did not get his CBC last Friday because there was not an order written.  I will go ahead and get an order written for this week.  Physical Examination:  Filed Vitals:   04/06/15 1415  BP: 127/77  Pulse: 78  Temp: 98.2 F (36.8 C)  Resp: 12  .  Weight: 117 lb 4.8 oz (53.207 kg).  There is erythema along her right chest wall with tumoritis along her medial right chest wall and lateral right chest wall nodular recurrences.  There is no skin breakdown.  Impression: Tolerating radiation therapy well.  Plan: Continue radiation therapy as planned.  Addendum: On writing an order for a CBC this week I see that an order was written for last Friday.  We will make sure that she gets his CBC this week.

## 2015-04-06 NOTE — Addendum Note (Signed)
Encounter addended by: Arloa Koh, MD on: 04/06/2015  2:38 PM<BR>     Documentation filed: Notes Section

## 2015-04-06 NOTE — Progress Notes (Signed)
She is currently in no pain.  Pt complains of fatigue .  Pt right breast- positive for erythema and breast tenderness.  Pt denies edema. Pt continues to apply Radiaplex as directed.  BP 127/77 mmHg  Pulse 78  Temp(Src) 98.2 F (36.8 C) (Oral)  Resp 12  Wt 117 lb 4.8 oz (53.207 kg)  SpO2 99% Missed last week labs appointment.  Reception told her she didn't have an appointment.

## 2015-04-07 ENCOUNTER — Ambulatory Visit
Admission: RE | Admit: 2015-04-07 | Discharge: 2015-04-07 | Disposition: A | Discharge status: Home / Self Care | Payer: Medicare Other | Source: Ambulatory Visit | Attending: Radiation Oncology | Admitting: Radiation Oncology

## 2015-04-07 DIAGNOSIS — C50119 Malignant neoplasm of central portion of unspecified female breast: Secondary | ICD-10-CM | POA: Diagnosis not present

## 2015-04-08 ENCOUNTER — Ambulatory Visit
Admission: RE | Admit: 2015-04-08 | Discharge: 2015-04-08 | Disposition: A | Discharge status: Home / Self Care | Payer: Medicare Other | Source: Ambulatory Visit | Attending: Radiation Oncology | Admitting: Radiation Oncology

## 2015-04-08 DIAGNOSIS — C50119 Malignant neoplasm of central portion of unspecified female breast: Secondary | ICD-10-CM | POA: Diagnosis not present

## 2015-04-09 ENCOUNTER — Ambulatory Visit
Admission: RE | Admit: 2015-04-09 | Discharge: 2015-04-09 | Disposition: A | Discharge status: Home / Self Care | Payer: Medicare Other | Source: Ambulatory Visit | Attending: Radiation Oncology | Admitting: Radiation Oncology

## 2015-04-09 DIAGNOSIS — C50119 Malignant neoplasm of central portion of unspecified female breast: Secondary | ICD-10-CM | POA: Diagnosis not present

## 2015-04-10 ENCOUNTER — Telehealth: Payer: Self-pay | Admitting: *Deleted

## 2015-04-10 ENCOUNTER — Ambulatory Visit
Admission: RE | Admit: 2015-04-10 | Discharge: 2015-04-10 | Disposition: A | Discharge status: Home / Self Care | Payer: Medicare Other | Source: Ambulatory Visit | Attending: Radiation Oncology | Admitting: Radiation Oncology

## 2015-04-10 DIAGNOSIS — C50119 Malignant neoplasm of central portion of unspecified female breast: Secondary | ICD-10-CM | POA: Diagnosis present

## 2015-04-10 LAB — CBC WITH DIFFERENTIAL/PLATELET
BASO%: 0.8 % (ref 0.0–2.0)
Basophils Absolute: 0 10*3/uL (ref 0.0–0.1)
EOS%: 3.1 % (ref 0.0–7.0)
Eosinophils Absolute: 0.2 10*3/uL (ref 0.0–0.5)
HEMATOCRIT: 34.1 % — AB (ref 34.8–46.6)
HGB: 11.4 g/dL — ABNORMAL LOW (ref 11.6–15.9)
LYMPH%: 13.7 % — AB (ref 14.0–49.7)
MCH: 31.3 pg (ref 25.1–34.0)
MCHC: 33.4 g/dL (ref 31.5–36.0)
MCV: 93.7 fL (ref 79.5–101.0)
MONO#: 0.7 10*3/uL (ref 0.1–0.9)
MONO%: 14.9 % — ABNORMAL HIGH (ref 0.0–14.0)
NEUT#: 3.3 10*3/uL (ref 1.5–6.5)
NEUT%: 67.5 % (ref 38.4–76.8)
NRBC: 0 % (ref 0–0)
Platelets: 177 10*3/uL (ref 145–400)
RBC: 3.64 10*6/uL — ABNORMAL LOW (ref 3.70–5.45)
RDW: 15.3 % — AB (ref 11.2–14.5)
WBC: 4.8 10*3/uL (ref 3.9–10.3)
lymph#: 0.7 10*3/uL — ABNORMAL LOW (ref 0.9–3.3)

## 2015-04-10 NOTE — Telephone Encounter (Signed)
Called patient to remind to get lab this afternoon @ 1:15 pm prior to her treatment today.

## 2015-04-13 ENCOUNTER — Ambulatory Visit
Admission: RE | Admit: 2015-04-13 | Discharge: 2015-04-13 | Disposition: A | Discharge status: Home / Self Care | Payer: Medicare Other | Source: Ambulatory Visit | Attending: Radiation Oncology | Admitting: Radiation Oncology

## 2015-04-13 ENCOUNTER — Encounter: Payer: Self-pay | Admitting: *Deleted

## 2015-04-13 DIAGNOSIS — C50119 Malignant neoplasm of central portion of unspecified female breast: Secondary | ICD-10-CM | POA: Insufficient documentation

## 2015-04-13 DIAGNOSIS — L599 Disorder of the skin and subcutaneous tissue related to radiation, unspecified: Secondary | ICD-10-CM | POA: Insufficient documentation

## 2015-04-13 DIAGNOSIS — C50911 Malignant neoplasm of unspecified site of right female breast: Secondary | ICD-10-CM

## 2015-04-13 MED ORDER — BIAFINE EX EMUL
Freq: Once | CUTANEOUS | Status: AC
  Administered 2015-04-13: 18:00:00 via TOPICAL

## 2015-04-13 MED ORDER — RADIAPLEXRX EX GEL
Freq: Once | CUTANEOUS | Status: AC
  Administered 2015-04-13: 18:00:00 via TOPICAL

## 2015-04-13 NOTE — Progress Notes (Signed)
Called patient (spoke with her daughter-Deborah) to notify that her appointment on Friday with Dr. Isidore Moos is at 12:50pm. Informed that Biafine will be waiting for her at the treatment machine and to use just like she was the radiaplex (bid).  Daughter states Mrs. Jain "did something" to her knuckle on her left first finger, she states it looks "rough" and "swollen." She stated the patient is actually in tears.  She is unsure what she did.  Instructed her to go to the emergency room.  Daughter states she will if it keeps bothering her. Notified physician.

## 2015-04-13 NOTE — Progress Notes (Signed)
   Weekly Management Note:  Outpatient      ICD-9-CM ICD-10-CM   1. Cancer of central portion of breast 174.1 C50.119      Current Dose:40Gy (with Xeloda) Total anticipated dose: 60Gy    Narrative:  The patient presents for routine under treatment assessment.  CBCT/MVCT images/Port film x-rays were reviewed.  Chest wall nodules are regressing, still.  Patient seen in [facility] room   Physical Findings:     Vitals with BMI 04/13/2015  Height   Weight 117 lbs 3 oz  BMI   Systolic 601  Diastolic 86  Pulse 84  Respirations 12   Right Chest wall nodules are regressing, still. Skin intact and erythematous   CBC  CBC    Component Value Date/Time   WBC 4.8 04/10/2015 1324   WBC 5.1 01/26/2015 1153   RBC 3.64* 04/10/2015 1324   RBC 3.61* 01/26/2015 1153   HGB 11.4* 04/10/2015 1324   HGB 11.0* 01/26/2015 1153   HCT 34.1* 04/10/2015 1324   HCT 32.8* 01/26/2015 1153   PLT 177 04/10/2015 1324   PLT 201 01/26/2015 1153   MCV 93.7 04/10/2015 1324   MCV 90.9 01/26/2015 1153   MCH 31.3 04/10/2015 1324   MCH 30.5 01/26/2015 1153   MCHC 33.4 04/10/2015 1324   MCHC 33.5 01/26/2015 1153   RDW 15.3* 04/10/2015 1324   RDW 16.9* 01/26/2015 1153   LYMPHSABS 0.7* 04/10/2015 1324   LYMPHSABS 2.2 03/07/2012 1202   MONOABS 0.7 04/10/2015 1324   MONOABS 0.4 03/07/2012 1202   EOSABS 0.2 04/10/2015 1324   EOSABS 0.2 03/07/2012 1202   BASOSABS 0.0 04/10/2015 1324   BASOSABS 0.0 03/07/2012 1202      Impression:  The patient is tolerating radiotherapy.   Plan:  Continue radiotherapy as planned with xeloda. Lab results CC'd to Dr Tressie Stalker.  -----------------------------------  Eppie Gibson, MD

## 2015-04-13 NOTE — Progress Notes (Signed)
She is currently in no pain.  Pt complains of fatigue .  Pt right breast- positive for Pruritus, erythema and breast tenderness, reports right axilla area is "very" tender.  Pt denies edema . Pt continues to apply Radiaplex as directed.  BP 116/86 mmHg  Pulse 84  Temp(Src) 97.9 F (36.6 C) (Oral)  Resp 12  Wt 117 lb 3.2 oz (53.162 kg)  SpO2 100%

## 2015-04-14 ENCOUNTER — Ambulatory Visit
Admission: RE | Admit: 2015-04-14 | Discharge: 2015-04-14 | Disposition: A | Discharge status: Home / Self Care | Payer: Medicare Other | Source: Ambulatory Visit | Attending: Radiation Oncology | Admitting: Radiation Oncology

## 2015-04-14 DIAGNOSIS — C50119 Malignant neoplasm of central portion of unspecified female breast: Secondary | ICD-10-CM | POA: Diagnosis not present

## 2015-04-15 ENCOUNTER — Ambulatory Visit
Admission: RE | Admit: 2015-04-15 | Discharge: 2015-04-15 | Disposition: A | Discharge status: Home / Self Care | Payer: Medicare Other | Source: Ambulatory Visit | Attending: Radiation Oncology | Admitting: Radiation Oncology

## 2015-04-15 ENCOUNTER — Telehealth: Payer: Self-pay | Admitting: *Deleted

## 2015-04-15 DIAGNOSIS — C50119 Malignant neoplasm of central portion of unspecified female breast: Secondary | ICD-10-CM | POA: Diagnosis not present

## 2015-04-15 NOTE — Telephone Encounter (Signed)
Called [name] to inform her that her under treatment appointments with Dr. Isidore Moos are Friday 04/17/15 at 12:50 pm and Monday 04/27/15 at 12:50 pm.  She is pleased with the scheduling adjustment.  She will notify us if she has any other questions or concerns.

## 2015-04-16 ENCOUNTER — Encounter: Payer: Self-pay | Admitting: Radiation Oncology

## 2015-04-16 ENCOUNTER — Ambulatory Visit
Admission: RE | Admit: 2015-04-16 | Discharge: 2015-04-16 | Disposition: A | Discharge status: Home / Self Care | Payer: Medicare Other | Source: Ambulatory Visit | Attending: Radiation Oncology | Admitting: Radiation Oncology

## 2015-04-16 DIAGNOSIS — C50119 Malignant neoplasm of central portion of unspecified female breast: Secondary | ICD-10-CM | POA: Diagnosis not present

## 2015-04-17 ENCOUNTER — Ambulatory Visit
Admission: RE | Admit: 2015-04-17 | Discharge: 2015-04-17 | Disposition: A | Discharge status: Home / Self Care | Payer: Medicare Other | Source: Ambulatory Visit | Attending: Radiation Oncology | Admitting: Radiation Oncology

## 2015-04-17 VITALS — BP 148/63 | HR 95 | Temp 97.9°F | Wt 118.2 lb

## 2015-04-17 DIAGNOSIS — C50119 Malignant neoplasm of central portion of unspecified female breast: Secondary | ICD-10-CM

## 2015-04-17 LAB — CBC WITH DIFFERENTIAL/PLATELET
BASO%: 0.5 % (ref 0.0–2.0)
Basophils Absolute: 0 10*3/uL (ref 0.0–0.1)
EOS ABS: 0.2 10*3/uL (ref 0.0–0.5)
EOS%: 3.9 % (ref 0.0–7.0)
HCT: 32.6 % — ABNORMAL LOW (ref 34.8–46.6)
HGB: 10.8 g/dL — ABNORMAL LOW (ref 11.6–15.9)
LYMPH%: 12.7 % — ABNORMAL LOW (ref 14.0–49.7)
MCH: 30.9 pg (ref 25.1–34.0)
MCHC: 33.1 g/dL (ref 31.5–36.0)
MCV: 93.4 fL (ref 79.5–101.0)
MONO#: 0.8 10*3/uL (ref 0.1–0.9)
MONO%: 17.6 % — ABNORMAL HIGH (ref 0.0–14.0)
NEUT#: 2.8 10*3/uL (ref 1.5–6.5)
NEUT%: 65.3 % (ref 38.4–76.8)
PLATELETS: 192 10*3/uL (ref 145–400)
RBC: 3.49 10*6/uL — ABNORMAL LOW (ref 3.70–5.45)
RDW: 15.6 % — AB (ref 11.2–14.5)
WBC: 4.3 10*3/uL (ref 3.9–10.3)
lymph#: 0.6 10*3/uL — ABNORMAL LOW (ref 0.9–3.3)

## 2015-04-17 NOTE — Progress Notes (Signed)
Weekly assessment of radiation to right chest wall.Denies pain.Skin is dark red.Has started applying biafine.Informed to apply 3 times daily over the weekend.Left hand is bruised but no longer painful.

## 2015-04-17 NOTE — Progress Notes (Signed)
   Weekly Management Note:  Outpatient      ICD-9-CM ICD-10-CM   1. Cancer of central portion of breast 174.1 C50.119      Current Dose: 44 Gy (with Xeloda) Total anticipated dose: 60 Gy    Narrative:  The patient presents for routine under treatment assessment.  CBCT/MVCT images/Port film x-rays were reviewed. No complaints, no GI issues.  Physical Findings:      Filed Vitals:   04/17/15 1241  BP: 148/63  Pulse: 95  Temp: 97.9 F (36.6 C)   Filed Weights   04/17/15 1241  Weight: 118 lb 3.2 oz (53.615 kg)    NAD, diffuse bright erythema over right chest wall.  Nodules over chest wall have regressed significantly over the treatment course. No new nodules appreciated outside fields. Skin intact.   CBC    Component Value Date/Time   WBC 4.8 04/10/2015 1324   WBC 5.1 01/26/2015 1153   RBC 3.64* 04/10/2015 1324   RBC 3.61* 01/26/2015 1153   HGB 11.4* 04/10/2015 1324   HGB 11.0* 01/26/2015 1153   HCT 34.1* 04/10/2015 1324   HCT 32.8* 01/26/2015 1153   PLT 177 04/10/2015 1324   PLT 201 01/26/2015 1153   MCV 93.7 04/10/2015 1324   MCV 90.9 01/26/2015 1153   MCH 31.3 04/10/2015 1324   MCH 30.5 01/26/2015 1153   MCHC 33.4 04/10/2015 1324   MCHC 33.5 01/26/2015 1153   RDW 15.3* 04/10/2015 1324   RDW 16.9* 01/26/2015 1153   LYMPHSABS 0.7* 04/10/2015 1324   LYMPHSABS 2.2 03/07/2012 1202   MONOABS 0.7 04/10/2015 1324   MONOABS 0.4 03/07/2012 1202   EOSABS 0.2 04/10/2015 1324   EOSABS 0.2 03/07/2012 1202   BASOSABS 0.0 04/10/2015 1324   BASOSABS 0.0 03/07/2012 1202     Impression:  The patient is tolerating radiotherapy.   Plan:  Continue radiotherapy as planned with xeloda.   This document serves as a record of services personally performed by Eppie Gibson, MD. It was created on her behalf by Darcus Austin, a trained medical scribe. The creation of this record is based on the scribe's personal observations and the provider's statements to them. This document has been  checked and approved by the attending provider.     -----------------------------------  Eppie Gibson, MD

## 2015-04-21 ENCOUNTER — Encounter: Payer: Medicare Other | Admitting: Gynecology

## 2015-04-21 ENCOUNTER — Ambulatory Visit
Admission: RE | Admit: 2015-04-21 | Discharge: 2015-04-21 | Disposition: A | Discharge status: Home / Self Care | Payer: Medicare Other | Source: Ambulatory Visit | Attending: Radiation Oncology | Admitting: Radiation Oncology

## 2015-04-21 DIAGNOSIS — C50119 Malignant neoplasm of central portion of unspecified female breast: Secondary | ICD-10-CM | POA: Diagnosis not present

## 2015-04-22 ENCOUNTER — Ambulatory Visit
Admission: RE | Admit: 2015-04-22 | Discharge: 2015-04-22 | Disposition: A | Discharge status: Home / Self Care | Payer: Medicare Other | Source: Ambulatory Visit | Attending: Radiation Oncology | Admitting: Radiation Oncology

## 2015-04-22 DIAGNOSIS — C50119 Malignant neoplasm of central portion of unspecified female breast: Secondary | ICD-10-CM | POA: Diagnosis not present

## 2015-04-23 ENCOUNTER — Ambulatory Visit
Admission: RE | Admit: 2015-04-23 | Discharge: 2015-04-23 | Disposition: A | Discharge status: Home / Self Care | Payer: Medicare Other | Source: Ambulatory Visit | Attending: Radiation Oncology | Admitting: Radiation Oncology

## 2015-04-23 DIAGNOSIS — C50119 Malignant neoplasm of central portion of unspecified female breast: Secondary | ICD-10-CM | POA: Diagnosis not present

## 2015-04-24 ENCOUNTER — Ambulatory Visit
Admission: RE | Admit: 2015-04-24 | Discharge: 2015-04-24 | Disposition: A | Discharge status: Home / Self Care | Payer: Medicare Other | Source: Ambulatory Visit | Attending: Radiation Oncology | Admitting: Radiation Oncology

## 2015-04-24 ENCOUNTER — Encounter: Payer: Self-pay | Admitting: Radiation Oncology

## 2015-04-24 DIAGNOSIS — C50119 Malignant neoplasm of central portion of unspecified female breast: Secondary | ICD-10-CM

## 2015-04-24 LAB — CBC WITH DIFFERENTIAL/PLATELET
BASO%: 0.9 % (ref 0.0–2.0)
BASOS ABS: 0 10*3/uL (ref 0.0–0.1)
EOS ABS: 0.2 10*3/uL (ref 0.0–0.5)
EOS%: 3.5 % (ref 0.0–7.0)
HEMATOCRIT: 33 % — AB (ref 34.8–46.6)
HGB: 11 g/dL — ABNORMAL LOW (ref 11.6–15.9)
LYMPH#: 0.6 10*3/uL — AB (ref 0.9–3.3)
LYMPH%: 11.8 % — ABNORMAL LOW (ref 14.0–49.7)
MCH: 31.5 pg (ref 25.1–34.0)
MCHC: 33.4 g/dL (ref 31.5–36.0)
MCV: 94.2 fL (ref 79.5–101.0)
MONO#: 0.8 10*3/uL (ref 0.1–0.9)
MONO%: 17.6 % — AB (ref 0.0–14.0)
NEUT%: 66.2 % (ref 38.4–76.8)
NEUTROS ABS: 3.1 10*3/uL (ref 1.5–6.5)
Platelets: 203 10*3/uL (ref 145–400)
RBC: 3.5 10*6/uL — AB (ref 3.70–5.45)
RDW: 16.5 % — ABNORMAL HIGH (ref 11.2–14.5)
WBC: 4.7 10*3/uL (ref 3.9–10.3)

## 2015-04-27 ENCOUNTER — Ambulatory Visit: Admission: RE | Admit: 2015-04-27 | Payer: Medicare Other | Source: Ambulatory Visit

## 2015-04-27 ENCOUNTER — Ambulatory Visit: Payer: Medicare Other

## 2015-04-27 ENCOUNTER — Encounter: Payer: Self-pay | Admitting: Radiation Oncology

## 2015-04-27 ENCOUNTER — Ambulatory Visit
Admission: RE | Admit: 2015-04-27 | Discharge: 2015-04-27 | Disposition: A | Discharge status: Home / Self Care | Payer: Medicare Other | Source: Ambulatory Visit | Attending: Radiation Oncology | Admitting: Radiation Oncology

## 2015-04-27 VITALS — BP 138/57 | HR 92 | Temp 98.2°F | Resp 20 | Wt 117.9 lb

## 2015-04-27 DIAGNOSIS — G893 Neoplasm related pain (acute) (chronic): Secondary | ICD-10-CM | POA: Insufficient documentation

## 2015-04-27 DIAGNOSIS — C50119 Malignant neoplasm of central portion of unspecified female breast: Secondary | ICD-10-CM

## 2015-04-27 DIAGNOSIS — L599 Disorder of the skin and subcutaneous tissue related to radiation, unspecified: Secondary | ICD-10-CM | POA: Diagnosis not present

## 2015-04-27 DIAGNOSIS — C50111 Malignant neoplasm of central portion of right female breast: Secondary | ICD-10-CM | POA: Diagnosis not present

## 2015-04-27 DIAGNOSIS — C50911 Malignant neoplasm of unspecified site of right female breast: Secondary | ICD-10-CM

## 2015-04-27 MED ORDER — OXYCODONE HCL 5 MG PO TABS: ORAL_TABLET | ORAL | Status: AC

## 2015-04-27 MED ORDER — SENNOSIDES-DOCUSATE SODIUM 8.6-50 MG PO TABS: 1.0000 | ORAL_TABLET | Freq: Every day | ORAL | Status: DC

## 2015-04-27 MED ORDER — RADIAPLEXRX EX GEL
Freq: Once | CUTANEOUS | Status: AC
  Administered 2015-04-27: 13:00:00 via TOPICAL

## 2015-04-27 NOTE — Progress Notes (Addendum)
   Weekly Management Note:  Outpatient      ICD-9-CM ICD-10-CM   1. Cancer of central portion of breast 174.1 C50.119      Current Dose: 56 Gy (with Xeloda) Total anticipated dose: 60 Gy    Narrative:  The patient presents for routine under treatment assessment.  CBCT/MVCT images/Port film x-rays were reviewed.   Here before treatment today c/o of 48 hours of pain on chest wall, pain level 8/10,  uses either radiaplex and neosporin, tried oxycodone 5mg  for pain this weekend but "didn't touch it" per daughter  Physical Findings:      Filed Vitals:   04/27/15 1245  BP: 138/57  Pulse: 92  Temp: 98.2 F (36.8 C)  Resp: 20   Filed Weights   04/27/15 1245  Weight: 117 lb 14.4 oz (53.479 kg)    NAD, diffuse bright erythema over right chest wall.  Nodules over chest wall have regressed significantly over the treatment course. No new nodules appreciated outside fields. Skin with moist desquamation in axilla and UIQ of chest wall   CBC    Component Value Date/Time   WBC 4.7 04/24/2015 1254   WBC 5.1 01/26/2015 1153   RBC 3.50* 04/24/2015 1254   RBC 3.61* 01/26/2015 1153   HGB 11.0* 04/24/2015 1254   HGB 11.0* 01/26/2015 1153   HCT 33.0* 04/24/2015 1254   HCT 32.8* 01/26/2015 1153   PLT 203 04/24/2015 1254   PLT 201 01/26/2015 1153   MCV 94.2 04/24/2015 1254   MCV 90.9 01/26/2015 1153   MCH 31.5 04/24/2015 1254   MCH 30.5 01/26/2015 1153   MCHC 33.4 04/24/2015 1254   MCHC 33.5 01/26/2015 1153   RDW 16.5* 04/24/2015 1254   RDW 16.9* 01/26/2015 1153   LYMPHSABS 0.6* 04/24/2015 1254   LYMPHSABS 2.2 03/07/2012 1202   MONOABS 0.8 04/24/2015 1254   MONOABS 0.4 03/07/2012 1202   EOSABS 0.2 04/24/2015 1254   EOSABS 0.2 03/07/2012 1202   BASOSABS 0.0 04/24/2015 1254   BASOSABS 0.0 03/07/2012 1202     Impression:  The patient is having skin irritation, pain from RT   Plan: Discontinue RT. We discussed that her last 2 fractions would be outside of the current area of moist  desquamation and significant pain. We could continue if she wishes. But, she doesn't want to risk spreading pain and irritation to other parts of the chest wall. Oxycodone 5mg  tabs Rx given, take 1-2 q 4hrs. Senna-S Rx given as well. F/u in 1 mo. Refills on skin topical given. Neosporin, radiaplex.   -----------------------------------  Eppie Gibson, MD

## 2015-04-27 NOTE — Progress Notes (Signed)
Weekly rad txs right chest wall completed 28 completed, here before treatment today escorition on chest wall, pain level 8/10, , uses either radiaplex or biaifine, stated 1 was worse, has decided not to do any more radiation',now' BP 138/57 mmHg  Pulse 92  Temp(Src) 98.2 F (36.8 C) (Oral)  Resp 20  Wt 117 lb 14.4 oz (53.479 kg)  Wt Readings from Last 3 Encounters:  04/17/15 118 lb 3.2 oz (53.615 kg)  03/30/15 117 lb 8 oz (53.298 kg)  01/22/15 119 lb 11.2 oz (54.296 kg)

## 2015-04-28 ENCOUNTER — Ambulatory Visit: Payer: Medicare Other

## 2015-05-03 NOTE — Progress Notes (Signed)
  Radiation Oncology         (336) [phone] ________________________________  Name: [name] MRN: [id number]  Date: 04/24/2015  DOB: Aug 22, 1920  End of Treatment Note   DIAGNOSIS: Triple Negative Right breast cancer [id number] Stage III   Indication for treatment:  Aggressive palliation       Radiation treatment dates:   03/17/2015-04/24/2015  Site/dose:   Site/dose:    1) Right Chest Wall / 44 Gy in 22 fractions 2) Right Supraclavicular fossa and mid axilla / 44 Gy in 22 fractions 3) Right Chest Wall Scar boost / 12 Gy in 6 fractions  Beams/energy:     1) 3D Conformal tangents /   6 MV photons 2) 3D photon, 2 fields / 6 MV photons 3) En face electrons / 6 MeV electrons    Narrative: The patient tolerated radiation treatment relatively well.   She receiving concurrent Xeloda for radiosensitization of intact disease.  She was prescribed 16 Gy in 8 fractions for her scar boost, but only received 12 Gy 6 fractions due to skin toxicity including moist desquamation/pain.    Plan: The patient has completed radiation treatment. The patient will return to radiation oncology clinic for routine followup in one month. I advised them to call or return sooner if they have any questions or concerns related to their recovery or treatment.  -----------------------------------  Eppie Gibson, MD

## 2015-05-19 ENCOUNTER — Telehealth: Payer: Self-pay | Admitting: Radiation Oncology

## 2015-05-19 NOTE — Telephone Encounter (Signed)
Received message that patient's daughter, Su Grand, called the answering service last night reference her mother's skin reaction to breast radiation. Returned call to patient's daughter. She explains her mother's treated skin is dry and itchy. Encouraged her to apply over the counter fragrance free lotion preferably with coco butter to skin bid. After allowing lotion time to dry then, apply hydrocortisone 1% to areas that itch. She verbalized understanding. Confirmed 7/1 follow up appointment with Dr. Isidore Moos.

## 2015-05-22 ENCOUNTER — Encounter: Payer: Self-pay | Admitting: Radiation Oncology

## 2015-05-22 ENCOUNTER — Ambulatory Visit
Admission: RE | Admit: 2015-05-22 | Discharge: 2015-05-22 | Disposition: A | Discharge status: Home / Self Care | Payer: Medicare Other | Source: Ambulatory Visit | Attending: Radiation Oncology | Admitting: Radiation Oncology

## 2015-05-22 VITALS — BP 127/77 | HR 92 | Temp 98.9°F | Resp 12 | Wt 116.6 lb

## 2015-05-22 DIAGNOSIS — C50119 Malignant neoplasm of central portion of unspecified female breast: Secondary | ICD-10-CM

## 2015-05-22 NOTE — Progress Notes (Signed)
Radiation Oncology         [phone] ________________________________  Name: [name] MRN: [id number]  Date: 05/22/2015  DOB: 03-20-1920  Follow-Up Visit Note  outpatient  CC: Geoffery Lyons, MD  Fanny Skates, MD  Diagnosis and Prior Radiotherapy:    ICD-9-CM ICD-10-CM   1. Cancer of central portion of breast 174.1 C50.119    Triple Negative Right breast cancer [id number] Stage III  Radiation treatment dates: 03/17/2015-04/24/2015 Site/dose: 1) Right Chest Wall / 44 Gy in 22 fractions 2) Right Supraclavicular fossa and mid axilla / 44 Gy in 22 fractions 3) Right Chest Wall Scar boost / 12 Gy in 6 fractions  Narrative:  The patient returns today for routine follow-up.  She is currently in no pain.  Pt right chest wall- positive for pruritus, erythema and breast tenderness. She describes it as an "uncomfortable burning" sensation. Pt denies edema. Pt continues to apply Neosporin (triple antibiotic ointment) as directed.  ALLERGIES:  is allergic to penicillins; penicillins; and tape.  Meds: Current Outpatient Prescriptions  Medication Sig Dispense Refill  . aspirin 81 MG chewable tablet Chew 1 tablet by mouth.    Marland Kitchen aspirin 81 MG tablet Take 81 mg by mouth daily.    . capecitabine (XELODA) 500 MG tablet Capecitabine 500mg - two pills every twelve hours starting Sunday night through Friday morning during radiation treatments.    . Cholecalciferol (VITAMIN D3) 5000 UNITS CAPS Take 1 capsule by mouth daily.    . Cranberry 475 MG CAPS Take 1 capsule by mouth daily.    Marland Kitchen emollient (BIAFINE) cream Apply topically as needed.    . hyaluronate sodium (RADIAPLEXRX) GEL Apply 1 application topically once.    . lidocaine-prilocaine (EMLA) cream Apply topically.    . Multiple Vitamins-Minerals (CENTRUM SILVER ULTRA WOMENS PO) Take 1 tablet by mouth daily.    Marland Kitchen oxyCODONE (OXY IR/ROXICODONE) 5 MG immediate release tablet Take 1-2 tablets q 4hrs PRN pain. 90 tablet 0  . Probiotic  Product (PROBIOTIC DAILY PO) Take 1 capsule by mouth daily.    Marland Kitchen senna-docusate (SENOKOT-S) 8.6-50 MG per tablet Take 1 tablet by mouth daily. Take on days that you are taking oxycodone. Stop if you have diarrhea. 30 tablet 0  . zolpidem (AMBIEN) 5 MG tablet Take 1 tablet by mouth.     No current facility-administered medications for this encounter.    Physical Findings: The patient is in no acute distress. Patient is alert and oriented.  weight is 116 lb 9.6 oz (52.889 kg). Her oral temperature is 98.9 F (37.2 C). Her blood pressure is 127/77 and her pulse is 92. Her respiration is 12 and oxygen saturation is 97%. .    Right chest wall skin is intact with residual erythema and hyperpigmentation. The tumor nodularity along the chest wall scar has significantly resolved with some flattened remaining nodules. She is ambulatory with a cane and well appearing.  Lab Findings: Lab Results  Component Value Date   WBC 4.7 04/24/2015   HGB 11.0* 04/24/2015   HCT 33.0* 04/24/2015   MCV 94.2 04/24/2015   PLT 203 04/24/2015    Radiographic Findings: No results found.  Impression/Plan:  Excellent response and regression of chest wall nodules. Skin healing well. She is at high risk for eventual progression given her high risk disease.  F/u with Dr Tressie Stalker next week. Follow up with me in 3 months and refer back to Dr. Dalbert Batman in 6 months.   This document serves as a record of  services personally performed by Eppie Gibson, MD. It was created on her behalf by Darcus Austin, a trained medical scribe. The creation of this record is based on the scribe's personal observations and the provider's statements to them. This document has been checked and approved by the attending provider.     _____________________________________   Eppie Gibson, MD

## 2015-05-22 NOTE — Progress Notes (Signed)
She is currently in no pain.   Pt right breast- positive for Pruritus, erythema and breast tenderness.  Pt denies edema. Pt continues to apply Neosporin (triple antibiotic ointment) as directed. BP 127/77 mmHg  Pulse 92  Temp(Src) 98.9 F (37.2 C) (Oral)  Resp 12  Wt 116 lb 9.6 oz (52.889 kg)  SpO2 97%  Pt reports: Yes No Comments  Tamoxifen []  [x]    Arimidex []  [x]    Mammogram [x]   Date: 2015 []  Unsure of date

## 2015-05-26 ENCOUNTER — Telehealth: Payer: Self-pay | Admitting: *Deleted

## 2015-05-26 NOTE — Telephone Encounter (Signed)
CALLED PATIENT'S DAUGHTER- DEBORAH BELTON TO INFORM OF APPT. WITH DR. Dalbert Batman FOR FU ON 06/29/15- ARRIVAL TIME - 4:30 PM AND HER 3 MONTH FU WITH DR. Isidore Moos ON 08-28-15 @ 2:20 PM, SPOKE WITH PATIENT'S DAUGHTER DEBORAH BELTON AND SHE AGREED TO THESE APPTS.

## 2015-06-08 NOTE — Progress Notes (Signed)
[employer] representative Note 04-03-15  Diagnosis: Breast Cancer   The patient's CT images from her initial simulation were reviewed to plan her boost treatment to her right chest wall scar.  Measurements were made regarding the size and depth of target The boost  will be delivered with two fields, using 6 MeV electrons; 16 Gy in 8 fractions has been prescribed to the 100% isodose line.   A isodose plan was reviewed and approved.  2 custom electron cut-outs will be used for her boost fields.  0.5 cm bolus will be used.  -----------------------------------  Eppie Gibson, MD

## 2015-06-08 NOTE — Progress Notes (Signed)
Simulation Verification Note  04-16-15  The patient was brought to the treatment unit and placed in the planned treatment position. The clinical setup was verified. The position, location, and shape of the electron light fields was reviewed. The targeted tissue appears to be appropriately covered by the beams, which are en face with the patient's skin. The patient's treatment will proceed as planned.  -----------------------------------  Eppie Gibson, MD

## 2015-07-14 ENCOUNTER — Other Ambulatory Visit (HOSPITAL_COMMUNITY): Payer: Self-pay | Admitting: Oncology

## 2015-07-14 DIAGNOSIS — K639 Disease of intestine, unspecified: Secondary | ICD-10-CM

## 2015-07-14 DIAGNOSIS — C50911 Malignant neoplasm of unspecified site of right female breast: Secondary | ICD-10-CM

## 2015-07-21 ENCOUNTER — Ambulatory Visit (HOSPITAL_COMMUNITY)
Admission: RE | Admit: 2015-07-21 | Discharge: 2015-07-21 | Disposition: A | Discharge status: Home / Self Care | Payer: Medicare Other | Source: Ambulatory Visit | Attending: Oncology | Admitting: Oncology

## 2015-07-21 DIAGNOSIS — K639 Disease of intestine, unspecified: Secondary | ICD-10-CM | POA: Insufficient documentation

## 2015-07-21 DIAGNOSIS — Z79899 Other long term (current) drug therapy: Secondary | ICD-10-CM | POA: Insufficient documentation

## 2015-07-21 DIAGNOSIS — Z9011 Acquired absence of right breast and nipple: Secondary | ICD-10-CM | POA: Insufficient documentation

## 2015-07-21 DIAGNOSIS — C50911 Malignant neoplasm of unspecified site of right female breast: Secondary | ICD-10-CM

## 2015-07-21 MED ORDER — FLUDEOXYGLUCOSE F - 18 (FDG) INJECTION
6.9000 | Freq: Once | INTRAVENOUS | Status: DC | PRN
  Administered 2015-07-21: 6.9 via INTRAVENOUS
  Filled 2015-07-21: qty 6.9

## 2015-07-22 ENCOUNTER — Encounter: Payer: Self-pay | Admitting: Radiation Oncology

## 2015-07-22 ENCOUNTER — Ambulatory Visit
Admission: RE | Admit: 2015-07-22 | Discharge: 2015-07-22 | Disposition: A | Discharge status: Home / Self Care | Payer: Medicare Other | Source: Ambulatory Visit | Attending: Radiation Oncology | Admitting: Radiation Oncology

## 2015-07-22 ENCOUNTER — Other Ambulatory Visit: Payer: Self-pay | Admitting: Radiation Oncology

## 2015-07-22 VITALS — BP 156/72 | HR 88 | Temp 98.2°F | Resp 12 | Wt 119.4 lb

## 2015-07-22 DIAGNOSIS — Z510 Encounter for antineoplastic radiation therapy: Secondary | ICD-10-CM | POA: Insufficient documentation

## 2015-07-22 DIAGNOSIS — K639 Disease of intestine, unspecified: Secondary | ICD-10-CM | POA: Diagnosis not present

## 2015-07-22 DIAGNOSIS — C50119 Malignant neoplasm of central portion of unspecified female breast: Secondary | ICD-10-CM

## 2015-07-22 DIAGNOSIS — Z91048 Other nonmedicinal substance allergy status: Secondary | ICD-10-CM | POA: Insufficient documentation

## 2015-07-22 DIAGNOSIS — Z880 Allergy status to penicillin: Secondary | ICD-10-CM | POA: Insufficient documentation

## 2015-07-22 DIAGNOSIS — K6389 Other specified diseases of intestine: Secondary | ICD-10-CM

## 2015-07-22 LAB — GLUCOSE, CAPILLARY: Glucose-Capillary: 100 mg/dL — ABNORMAL HIGH (ref 65–99)

## 2015-07-22 NOTE — Progress Notes (Signed)
  Radiation Oncology         [phone] ________________________________  Name: [name] MRN: [id number]  Date: 07/22/2015  DOB: 1920/04/30  SIMULATION AND TREATMENT PLANNING NOTE    Outpatient  DIAGNOSIS:     ICD-9-CM ICD-10-CM   1. Cancer of central portion of breast 174.1 C50.119     NARRATIVE:  The patient was brought to the [facility].  Identity was confirmed.  All relevant records and images related to the planned course of therapy were reviewed.  The patient freely provided informed written consent to proceed with treatment after reviewing the details related to the planned course of therapy. The consent form was witnessed and verified by the simulation staff.    Then, the patient was set-up in a stable reproducible supine position for radiation therapy -- SHE COULD NOT RAISE HER LEFT ARM FOR TRADITIONAL TANGENTIAL FIELDS SO I DECIDED TO USE ELECTRON TECHNIQUE INSTEAD.  CT images were obtained.  Surface markings were placed.  The CT images were loaded into the planning software.    TREATMENT PLANNING NOTE: Treatment planning then occurred.  The radiation prescription was entered and confirmed.     A total of 1 medically necessary complex treatment devices were fabricated and supervised by me: 1 custom electron cut-out field to block normal tissue. I have requested : Electron [employer]      The patient will receive 10 Gy in 4 fractions to the nodule over her left breast with en face electrons.   This will be followed by a larger electron field to the majority of the left breast, preliminary plan for 35 Gy more, in 14 fractions.     Special Treatment Procedure Note: The patient received prior radiotherapy close to his current fields. There could be some overlap of radiation dose.  Prior regional radiotherapy increases the risk of side effects from treatment. I have considered this in the treatment planning process and have aimed to minimize tissue  overlap.  This increases the complexity of this patient's treatment and therefore this constitutes a special treatment procedure.  Special Treatment Procedure Note: The patient will be receiving chemotherapy concurrently. Chemotherapy heightens the risk of side effects. I have considered this during the patient's treatment planning process and will monitor the patient accordingly for side effects on a weekly basis. Concurrent chemotherapy increases the complexity of this patient's treatment and therefore this constitutes a special treatment procedure.  -----------------------------------  Eppie Gibson, MD

## 2015-07-22 NOTE — Progress Notes (Signed)
Location of Breast Cancer:left breast lesion  Past/Anticipated interventions by medical oncology, if any: Chemotherapy-   Lymphedema issues, if any: no  Pain issues, if any: reports pain to right breast side when moving arm.  Rating pain an  8/9 "tight" pain.   SAFETY ISSUES:  Prior radiation? yes  Pacemaker/ICD? no  Possible current pregnancy?no  Is the patient on methotrexate? no  Current Complaints / other details: PET scan yesterday. Port a Cath    Mouhamed Glassco, Donna Christen, RN 07/22/2015,10:33 AM

## 2015-07-22 NOTE — Progress Notes (Signed)
Radiation Oncology         (336) [phone] ________________________________  Name: [name] MRN: [id number]  Date: 07/22/2015  DOB: [birth year]  RECONSULT Note  outpatient  CC: Geoffery Lyons, MD  Fanny Skates, MD  Diagnosis and Prior Radiotherapy:    ICD-9-CM ICD-10-CM   1. Cancer of central portion of breast 174.1 C50.119    Triple Negative Right breast cancer T4bN0M0 Stage III Now with Left breast nodule  Radiation treatment dates: 03/17/2015-04/24/2015 Site/dose: 1) Right Chest Wall / 44 Gy in 22 fractions 2) Right Supraclavicular fossa and mid axilla / 44 Gy in 22 fractions 3) Right Chest Wall Scar boost / 12 Gy in 6 fractions  Narrative:  The patient returns today for reconsult.  The patient returns today for reconsult.  I saw her briefly with her Medical Oncologist last week at [facility].  She had a new superficial erythematous nodule over the left central breast concerning for a satellite lesion related to her prior right breast cancer.  PET scan was ordered to rule out distant metastatic disease.  This revealed no new signs of breast cancer but the lesion at the hepatic flexure of the colon appears to have progressed with a separate nodule in the ascending colon.  There may be partial obstruction.  I discussed this with the patient's daughter and will refer to the patient's Gastroenterologist, Dr. Cristina Gong.  Pt still has no GI complaints.  ALLERGIES:  is allergic to penicillins; penicillins; and tape.  Meds: Current Outpatient Prescriptions  Medication Sig Dispense Refill  . aspirin 81 MG chewable tablet Chew 1 tablet by mouth.    Marland Kitchen aspirin 81 MG tablet Take 81 mg by mouth daily.    . capecitabine (XELODA) 500 MG tablet Capecitabine 500mg - two pills every twelve hours starting Sunday night through Friday morning during radiation treatments.    . Cholecalciferol (VITAMIN D3) 5000 UNITS CAPS Take 1 capsule by mouth daily.    . Cranberry 475 MG CAPS Take 1  capsule by mouth daily.    Marland Kitchen emollient (BIAFINE) cream Apply topically as needed.    . hyaluronate sodium (RADIAPLEXRX) GEL Apply 1 application topically once.    . lidocaine-prilocaine (EMLA) cream Apply topically.    . Multiple Vitamins-Minerals (CENTRUM SILVER ULTRA WOMENS PO) Take 1 tablet by mouth daily.    Marland Kitchen oxyCODONE (OXY IR/ROXICODONE) 5 MG immediate release tablet Take 1-2 tablets q 4hrs PRN pain. 90 tablet 0  . Probiotic Product (PROBIOTIC DAILY PO) Take 1 capsule by mouth daily.    Marland Kitchen senna-docusate (SENOKOT-S) 8.6-50 MG per tablet Take 1 tablet by mouth daily. Take on days that you are taking oxycodone. Stop if you have diarrhea. 30 tablet 0  . zolpidem (AMBIEN) 5 MG tablet Take 1 tablet by mouth.     No current facility-administered medications for this encounter.   Facility-Administered Medications Ordered in Other Encounters  Medication Dose Route Frequency Provider Last Rate Last Dose  . fludeoxyglucose F - 18 (FDG) injection 6.9 milli Curie  6.9 milli Curie Intravenous Once PRN Medication Radiologist, MD   6.9 milli Curie at 07/21/15 1206    Physical Findings: The patient is in no acute distress. Patient is alert and oriented.  weight is 119 lb 6.4 oz (54.159 kg). Her oral temperature is 98.2 F (36.8 C). Her blood pressure is 156/72 and her pulse is 88. Her respiration is 12 and oxygen saturation is 99%. .     1 cm superficial erythematous nodule in the lower  outer quad of left breast.  Her abdomen is soft, non-tender, non-distended with no rigidity or guarding.       Lab Findings: Lab Results  Component Value Date   WBC 4.7 04/24/2015   HGB 11.0* 04/24/2015   HCT 33.0* 04/24/2015   MCV 94.2 04/24/2015   PLT 203 04/24/2015    Radiographic Findings: Nm Pet Image Restag (ps) Skull Base To Thigh  07/21/2015   CLINICAL DATA:  Subsequent treatment strategy for breast carcinoma. Lesion within the colon.  EXAM: NUCLEAR MEDICINE PET SKULL BASE TO THIGH  TECHNIQUE: 6.9  mCi F-18 FDG was injected intravenously. Full-ring PET imaging was performed from the skull base to thigh after the radiotracer. CT data was obtained and used for attenuation correction and anatomic localization.  FASTING BLOOD GLUCOSE:  Value: 100 mg/dl  COMPARISON:  None.  FINDINGS: NECK  No hypermetabolic lymph nodes in the neck.  CHEST  Linear activity associated with the upper RIGHT chest wall is felt be physiologic muscle skeletal activity.  There is volume loss in the RIGHT hemi thorax and a moderate fusion without evidence of focal metabolic activity. Post RIGHT mastectomy anatomy. No hypermetabolic axillary or mediastinal lymph nodes.  ABDOMEN/PELVIS  There is intense activity at the hepatic flexure of the colon with SUV max equal 11.8 (image 109 of the fused data set). There is circumferential thickening of the colon wall at this level (image 115, series 4). This thickening is advanced from PET CT scan of 03/05/2015 while the metabolic activity is similar. There is a moderate volume of stool in the ascending colon suggesting partial obstruction.  No abnormal metabolic activity the liver. No hypermetabolic abdominal pelvic lymph nodes.  There is intense activity associated nodule within the ascending colon measuring 20 mm on image 131 series 4 with SUV max equal 8.5. This nodularity appears to be removed from the ileocecal valve  SKELETON  No focal hypermetabolic activity to suggest skeletal metastasis.  IMPRESSION: 1. No evidence of local breast cancer recurrence. 2. Intense metabolic activity is at the hepatic flexure with circumferential bowel wall thickening is most consistent with adenocarcinoma of the colon. Recommend colonoscopy further evaluation. 3. Evidence of mild partial obstruction with stool in the ascending colon. 4. Separate hypermetabolic nodule in the ascending colon. Differential includes physiologic activity at the ileocecal valve versus a second neoplasm or pre neoplastic lesion.  Recommend evaluation with colonoscopy. These results will be called to the ordering clinician or representative by the Radiologist Assistant, and communication documented in the PACS or zVision Dashboard.   Electronically Signed   By: Suzy Bouchard M.D.   On: 07/21/2015 18:21    Impression/Plan:  Refer to her GI, Dr Cristina Gong, for colonoscopy if this is safe to do (colonic mass progressive on PET). I am not sure if stent is feasible.  Otherwise, RT to this area could be considered if a cancer is revealed and obstruction is a future worry.  Today, I talked to the patient about the findings and work-up thus far. We discussed the patient's diagnosis of  recurrent breast cancer and general treatment for this, highlighting the role of radiotherapy in the management. We discussed the available radiation techniques, and focused on the details of logistics and delivery.    We discussed the risks, benefits, and side effects of radiotherapy. Side effects may include but not necessarily be limited to: skin irritation, fatigue, internal organ injury. No guarantees of treatment were given. A consent form was signed and placed in the patient's  medical record. The patient was encouraged to ask questions that I answered to the best of my ability. Proceed with left breast simulation today, anticipate 4 weeks of treatment to left breast starting next week with concurrent Xeloda.    This document serves as a record of services personally performed by Eppie Gibson, MD. It was created on her behalf by Janace Hoard, a trained medical scribe. The creation of this record is based on the scribe's personal observations and the provider's statements to them. This document has been checked and approved by the attending provider.     _____________________________________   Eppie Gibson, MD

## 2015-07-23 ENCOUNTER — Telehealth: Payer: Self-pay | Admitting: *Deleted

## 2015-07-23 NOTE — Telephone Encounter (Signed)
CALLED PATIENT TO INFORM OF APPT. WITH DR. Cristina Gong ON 07-29-15- ARRIVAL TIME - 3:45 PM - ADDRESS - [address], [place]. NO. [phone]

## 2015-07-29 DIAGNOSIS — Z510 Encounter for antineoplastic radiation therapy: Secondary | ICD-10-CM | POA: Diagnosis not present

## 2015-07-30 ENCOUNTER — Ambulatory Visit
Admission: RE | Admit: 2015-07-30 | Discharge: 2015-07-30 | Disposition: A | Discharge status: Home / Self Care | Payer: Medicare Other | Source: Ambulatory Visit | Attending: Radiation Oncology | Admitting: Radiation Oncology

## 2015-07-30 DIAGNOSIS — Z510 Encounter for antineoplastic radiation therapy: Secondary | ICD-10-CM | POA: Diagnosis not present

## 2015-07-31 ENCOUNTER — Ambulatory Visit
Admission: RE | Admit: 2015-07-31 | Discharge: 2015-07-31 | Disposition: A | Discharge status: Home / Self Care | Payer: Medicare Other | Source: Ambulatory Visit | Attending: Radiation Oncology | Admitting: Radiation Oncology

## 2015-07-31 DIAGNOSIS — Z510 Encounter for antineoplastic radiation therapy: Secondary | ICD-10-CM | POA: Diagnosis not present

## 2015-08-03 ENCOUNTER — Ambulatory Visit
Admission: RE | Admit: 2015-08-03 | Discharge: 2015-08-03 | Disposition: A | Discharge status: Home / Self Care | Payer: Medicare Other | Source: Ambulatory Visit | Attending: Radiation Oncology | Admitting: Radiation Oncology

## 2015-08-03 ENCOUNTER — Encounter: Payer: Self-pay | Admitting: Radiation Oncology

## 2015-08-03 VITALS — BP 131/64 | HR 98 | Temp 98.1°F | Resp 20 | Wt 117.4 lb

## 2015-08-03 DIAGNOSIS — C50119 Malignant neoplasm of central portion of unspecified female breast: Secondary | ICD-10-CM

## 2015-08-03 DIAGNOSIS — Z510 Encounter for antineoplastic radiation therapy: Secondary | ICD-10-CM | POA: Diagnosis not present

## 2015-08-03 NOTE — Progress Notes (Signed)
   Weekly Management Note:  outpatient    ICD-9-CM ICD-10-CM   1. Cancer of central portion of breast 174.1 C50.119 CBC with Differential     CBC with Differential     CBC with Differential    Current Dose:  7.5 Gy  Projected Dose: 15 Gy initial treatment course, more to follow  Narrative:  The patient presents for routine under treatment assessment.  CBCT/MVCT images/Port film x-rays were reviewed.  The chart was checked. Feels weak, but ambulates with cane. No colonoscopy rec'd at this time by Dr Cristina Gong. " I have some blood in my stool" says patient.  Physical Findings:  weight is 117 lb 6.4 oz (53.252 kg). Her oral temperature is 98.1 F (36.7 C). Her blood pressure is 131/64 and her pulse is 98. Her respiration is 20.   Wt Readings from Last 3 Encounters:  08/03/15 117 lb 6.4 oz (53.252 kg)  07/22/15 119 lb 6.4 oz (54.159 kg)  05/22/15 116 lb 9.6 oz (52.889 kg)   NAD, single lateral erythematous nodule on left breast  Impression:  The patient is tolerating radiotherapy.  Plan:  Continue radiotherapy as planned.    ________________________________   Eppie Gibson, M.D.

## 2015-08-03 NOTE — Progress Notes (Signed)
Weekly rad txs left breast, 3/6 completed, no skin changes, no c/o pain, does c/o of being weak, is taking Xeloda bid as well, good appetite 2:15 PM BP 131/64 mmHg  Pulse 98  Temp(Src) 98.1 F (36.7 C) (Oral)  Resp 20  Wt 117 lb 6.4 oz (53.252 kg)  Wt Readings from Last 3 Encounters:  08/03/15 117 lb 6.4 oz (53.252 kg)  07/22/15 119 lb 6.4 oz (54.159 kg)  05/22/15 116 lb 9.6 oz (52.889 kg)

## 2015-08-04 ENCOUNTER — Ambulatory Visit
Admission: RE | Admit: 2015-08-04 | Discharge: 2015-08-04 | Disposition: A | Discharge status: Home / Self Care | Payer: Medicare Other | Source: Ambulatory Visit | Attending: Radiation Oncology | Admitting: Radiation Oncology

## 2015-08-04 DIAGNOSIS — Z510 Encounter for antineoplastic radiation therapy: Secondary | ICD-10-CM | POA: Diagnosis not present

## 2015-08-05 ENCOUNTER — Ambulatory Visit
Admission: RE | Admit: 2015-08-05 | Discharge: 2015-08-05 | Disposition: A | Discharge status: Home / Self Care | Payer: Medicare Other | Source: Ambulatory Visit | Attending: Radiation Oncology | Admitting: Radiation Oncology

## 2015-08-05 DIAGNOSIS — Z510 Encounter for antineoplastic radiation therapy: Secondary | ICD-10-CM | POA: Diagnosis not present

## 2015-08-06 ENCOUNTER — Ambulatory Visit
Admission: RE | Admit: 2015-08-06 | Discharge: 2015-08-06 | Disposition: A | Discharge status: Home / Self Care | Payer: Medicare Other | Source: Ambulatory Visit | Attending: Radiation Oncology | Admitting: Radiation Oncology

## 2015-08-06 DIAGNOSIS — Z510 Encounter for antineoplastic radiation therapy: Secondary | ICD-10-CM | POA: Diagnosis not present

## 2015-08-07 ENCOUNTER — Ambulatory Visit
Admission: RE | Admit: 2015-08-07 | Discharge: 2015-08-07 | Disposition: A | Discharge status: Home / Self Care | Payer: Medicare Other | Source: Ambulatory Visit | Attending: Radiation Oncology | Admitting: Radiation Oncology

## 2015-08-07 ENCOUNTER — Ambulatory Visit (HOSPITAL_BASED_OUTPATIENT_CLINIC_OR_DEPARTMENT_OTHER)
Admission: RE | Admit: 2015-08-07 | Discharge: 2015-08-07 | Disposition: A | Discharge status: Home / Self Care | Payer: Medicare Other | Source: Ambulatory Visit | Attending: Radiation Oncology | Admitting: Radiation Oncology

## 2015-08-07 DIAGNOSIS — Z510 Encounter for antineoplastic radiation therapy: Secondary | ICD-10-CM | POA: Diagnosis not present

## 2015-08-07 DIAGNOSIS — C50119 Malignant neoplasm of central portion of unspecified female breast: Secondary | ICD-10-CM | POA: Diagnosis not present

## 2015-08-07 LAB — CBC WITH DIFFERENTIAL/PLATELET
BASO%: 0.6 % (ref 0.0–2.0)
Basophils Absolute: 0 10*3/uL (ref 0.0–0.1)
EOS ABS: 0.2 10*3/uL (ref 0.0–0.5)
EOS%: 2.9 % (ref 0.0–7.0)
HCT: 32.8 % — ABNORMAL LOW (ref 34.8–46.6)
HEMOGLOBIN: 10.7 g/dL — AB (ref 11.6–15.9)
LYMPH%: 19.4 % (ref 14.0–49.7)
MCH: 28.2 pg (ref 25.1–34.0)
MCHC: 32.6 g/dL (ref 31.5–36.0)
MCV: 86.3 fL (ref 79.5–101.0)
MONO#: 0.7 10*3/uL (ref 0.1–0.9)
MONO%: 14.3 % — ABNORMAL HIGH (ref 0.0–14.0)
NEUT#: 3.2 10*3/uL (ref 1.5–6.5)
NEUT%: 62.8 % (ref 38.4–76.8)
Platelets: 307 10*3/uL (ref 145–400)
RBC: 3.8 10*6/uL (ref 3.70–5.45)
RDW: 14.9 % — AB (ref 11.2–14.5)
WBC: 5.2 10*3/uL (ref 3.9–10.3)
lymph#: 1 10*3/uL (ref 0.9–3.3)

## 2015-08-10 ENCOUNTER — Ambulatory Visit
Admission: RE | Admit: 2015-08-10 | Discharge: 2015-08-10 | Disposition: A | Discharge status: Home / Self Care | Payer: Medicare Other | Source: Ambulatory Visit | Attending: Radiation Oncology | Admitting: Radiation Oncology

## 2015-08-10 ENCOUNTER — Encounter: Payer: Self-pay | Admitting: Radiation Oncology

## 2015-08-10 VITALS — BP 151/64 | HR 79 | Temp 98.1°F | Resp 20 | Wt 121.0 lb

## 2015-08-10 DIAGNOSIS — Z510 Encounter for antineoplastic radiation therapy: Secondary | ICD-10-CM | POA: Diagnosis not present

## 2015-08-10 DIAGNOSIS — C50119 Malignant neoplasm of central portion of unspecified female breast: Secondary | ICD-10-CM

## 2015-08-10 NOTE — Progress Notes (Signed)
   Weekly Management Note:  outpatient    ICD-9-CM ICD-10-CM   1. Cancer of central portion of breast 174.1 C50.119     Current Dose:  20 Gy  Projected Dose: 50 Gy   Narrative:  The patient presents for routine under treatment assessment.  CBCT/MVCT images/Port film x-rays were reviewed.  The chart was checked. Lesion over left breast is "drying up" [place] get a ride to [place] on Wed, which also happens to be [birth year].  Physical Findings:  Wt Readings from Last 3 Encounters:  08/10/15 121 lb (54.885 kg)  08/03/15 117 lb 6.4 oz (53.252 kg)  07/22/15 119 lb 6.4 oz (54.159 kg)    weight is 121 lb (54.885 kg). Her oral temperature is 98.1 F (36.7 C). Her blood pressure is 151/64 and her pulse is 79. Her respiration is 20.  early dry desquamation of erythematous left breast lesion.  No new lesions. Remainder of left breast without irritation.  CBC    Component Value Date/Time   WBC 5.2 08/07/2015 0955   WBC 5.1 01/26/2015 1153   RBC 3.80 08/07/2015 0955   RBC 3.61* 01/26/2015 1153   HGB 10.7* 08/07/2015 0955   HGB 11.0* 01/26/2015 1153   HCT 32.8* 08/07/2015 0955   HCT 32.8* 01/26/2015 1153   PLT 307 08/07/2015 0955   PLT 201 01/26/2015 1153   MCV 86.3 08/07/2015 0955   MCV 90.9 01/26/2015 1153   MCH 28.2 08/07/2015 0955   MCH 30.5 01/26/2015 1153   MCHC 32.6 08/07/2015 0955   MCHC 33.5 01/26/2015 1153   RDW 14.9* 08/07/2015 0955   RDW 16.9* 01/26/2015 1153   LYMPHSABS 1.0 08/07/2015 0955   LYMPHSABS 2.2 03/07/2012 1202   MONOABS 0.7 08/07/2015 0955   MONOABS 0.4 03/07/2012 1202   EOSABS 0.2 08/07/2015 0955   EOSABS 0.2 03/07/2012 1202   BASOSABS 0.0 08/07/2015 0955   BASOSABS 0.0 03/07/2012 1202     CMP     Component Value Date/Time   NA 139 01/22/2015 1448   K 4.2 01/22/2015 1448   CL 108 01/22/2015 1448   CO2 26 01/22/2015 1448   GLUCOSE 139* 01/22/2015 1448   BUN 17 01/22/2015 1448   CREATININE 0.46* 01/26/2015 1153   CALCIUM 9.0 01/22/2015 1448   PROT  5.7* 03/10/2014 0400   ALBUMIN 2.8* 03/10/2014 0400   AST 16 03/10/2014 0400   ALT 17 03/10/2014 0400   ALKPHOS 122* 03/10/2014 0400   BILITOT 0.4 03/10/2014 0400   GFRNONAA 84* 01/26/2015 1153   GFRAA >90 01/26/2015 1153     Impression:  The patient is tolerating radiotherapy.   Plan:  Continue radiotherapy as planned. Okay to hold RT on her 89rd birthday. She is looking very good.  -----------------------------------  Eppie Gibson, MD

## 2015-08-10 NOTE — Progress Notes (Signed)
Weekly rad txs left breast,8/ completed so far, no skin irritation except a small scab area,dry, not using radiaplex yet but has on hand No pain appetite good, feels better this week  Had family in over the weekend BP 151/64 mmHg  Pulse 79  Temp(Src) 98.1 F (36.7 C) (Oral)  Resp 20  Wt 121 lb (54.885 kg)  Wt Readings from Last 3 Encounters:  08/10/15 121 lb (54.885 kg)  08/03/15 117 lb 6.4 oz (53.252 kg)  07/22/15 119 lb 6.4 oz (54.159 kg)

## 2015-08-11 ENCOUNTER — Ambulatory Visit
Admission: RE | Admit: 2015-08-11 | Discharge: 2015-08-11 | Disposition: A | Discharge status: Home / Self Care | Payer: Medicare Other | Source: Ambulatory Visit | Attending: Radiation Oncology | Admitting: Radiation Oncology

## 2015-08-11 DIAGNOSIS — Z510 Encounter for antineoplastic radiation therapy: Secondary | ICD-10-CM | POA: Diagnosis not present

## 2015-08-12 ENCOUNTER — Ambulatory Visit
Admission: RE | Admit: 2015-08-12 | Discharge: 2015-08-12 | Disposition: A | Discharge status: Home / Self Care | Payer: Medicare Other | Source: Ambulatory Visit | Attending: Radiation Oncology | Admitting: Radiation Oncology

## 2015-08-12 ENCOUNTER — Ambulatory Visit: Payer: Medicare Other

## 2015-08-13 ENCOUNTER — Ambulatory Visit
Admission: RE | Admit: 2015-08-13 | Discharge: 2015-08-13 | Disposition: A | Discharge status: Home / Self Care | Payer: Medicare Other | Source: Ambulatory Visit | Attending: Radiation Oncology | Admitting: Radiation Oncology

## 2015-08-13 DIAGNOSIS — Z510 Encounter for antineoplastic radiation therapy: Secondary | ICD-10-CM | POA: Diagnosis not present

## 2015-08-14 ENCOUNTER — Ambulatory Visit
Admission: RE | Admit: 2015-08-14 | Discharge: 2015-08-14 | Disposition: A | Discharge status: Home / Self Care | Payer: Medicare Other | Source: Ambulatory Visit | Attending: Radiation Oncology | Admitting: Radiation Oncology

## 2015-08-14 DIAGNOSIS — Z510 Encounter for antineoplastic radiation therapy: Secondary | ICD-10-CM | POA: Diagnosis not present

## 2015-08-14 DIAGNOSIS — C50119 Malignant neoplasm of central portion of unspecified female breast: Secondary | ICD-10-CM

## 2015-08-14 LAB — CBC WITH DIFFERENTIAL/PLATELET
BASO%: 0.9 % (ref 0.0–2.0)
BASOS ABS: 0 10*3/uL (ref 0.0–0.1)
EOS ABS: 0.1 10*3/uL (ref 0.0–0.5)
EOS%: 2.2 % (ref 0.0–7.0)
HCT: 34.2 % — ABNORMAL LOW (ref 34.8–46.6)
HEMOGLOBIN: 11 g/dL — AB (ref 11.6–15.9)
LYMPH%: 16.9 % (ref 14.0–49.7)
MCH: 27.9 pg (ref 25.1–34.0)
MCHC: 32.3 g/dL (ref 31.5–36.0)
MCV: 86.3 fL (ref 79.5–101.0)
MONO#: 0.8 10*3/uL (ref 0.1–0.9)
MONO%: 14.3 % — ABNORMAL HIGH (ref 0.0–14.0)
NEUT#: 3.4 10*3/uL (ref 1.5–6.5)
NEUT%: 65.7 % (ref 38.4–76.8)
Platelets: 311 10*3/uL (ref 145–400)
RBC: 3.96 10*6/uL (ref 3.70–5.45)
RDW: 16 % — AB (ref 11.2–14.5)
WBC: 5.3 10*3/uL (ref 3.9–10.3)
lymph#: 0.9 10*3/uL (ref 0.9–3.3)

## 2015-08-17 ENCOUNTER — Ambulatory Visit
Admission: RE | Admit: 2015-08-17 | Discharge: 2015-08-17 | Disposition: A | Discharge status: Home / Self Care | Payer: Medicare Other | Source: Ambulatory Visit | Attending: Radiation Oncology | Admitting: Radiation Oncology

## 2015-08-17 VITALS — BP 153/73 | HR 90 | Temp 98.0°F | Wt 118.2 lb

## 2015-08-17 DIAGNOSIS — Z510 Encounter for antineoplastic radiation therapy: Secondary | ICD-10-CM | POA: Diagnosis not present

## 2015-08-17 DIAGNOSIS — C50119 Malignant neoplasm of central portion of unspecified female breast: Secondary | ICD-10-CM

## 2015-08-17 NOTE — Progress Notes (Signed)
Weekly assessment of radiation to left breast.Completed 12 of 20 treatments .Mild tanning of skin.applying radiaplex 1 to 2 times.reinforce when to apply after treatment and at bedtime.Denies pain or fatigue. BP 153/73 mmHg  Pulse 90  Temp(Src) 98 F (36.7 C)  Wt 118 lb 3.2 oz (53.615 kg)

## 2015-08-17 NOTE — Progress Notes (Signed)
   Weekly Management Note:  outpatient    ICD-9-CM ICD-10-CM   1. Cancer of central portion of breast 174.1 C50.119     Current Dose:  30 Gy  Projected Dose: 50 Gy   Narrative:  The patient presents for routine under treatment assessment.  CBCT/MVCT images/Port film x-rays were reviewed.  The chart was checked. Lesion over left breast is still "drying up" and she is pleased.  No bowel complaints,  Taking Xeloda. No skin irritation   Physical Findings:  Wt Readings from Last 3 Encounters:  08/17/15 118 lb 3.2 oz (53.615 kg)  08/10/15 121 lb (54.885 kg)  08/03/15 117 lb 6.4 oz (53.252 kg)    weight is 118 lb 3.2 oz (53.615 kg). Her temperature is 98 F (36.7 C). Her blood pressure is 153/73 and her pulse is 90.    dry desquamation of erythematous left breast lesion.   Slight erythematous rash at lateral IM fold, left.  Remainder of left breast without irritation.  CBC    Component Value Date/Time   WBC 5.3 08/14/2015 1003   WBC 5.1 01/26/2015 1153   RBC 3.96 08/14/2015 1003   RBC 3.61* 01/26/2015 1153   HGB 11.0* 08/14/2015 1003   HGB 11.0* 01/26/2015 1153   HCT 34.2* 08/14/2015 1003   HCT 32.8* 01/26/2015 1153   PLT 311 08/14/2015 1003   PLT 201 01/26/2015 1153   MCV 86.3 08/14/2015 1003   MCV 90.9 01/26/2015 1153   MCH 27.9 08/14/2015 1003   MCH 30.5 01/26/2015 1153   MCHC 32.3 08/14/2015 1003   MCHC 33.5 01/26/2015 1153   RDW 16.0* 08/14/2015 1003   RDW 16.9* 01/26/2015 1153   LYMPHSABS 0.9 08/14/2015 1003   LYMPHSABS 2.2 03/07/2012 1202   MONOABS 0.8 08/14/2015 1003   MONOABS 0.4 03/07/2012 1202   EOSABS 0.1 08/14/2015 1003   EOSABS 0.2 03/07/2012 1202   BASOSABS 0.0 08/14/2015 1003   BASOSABS 0.0 03/07/2012 1202     CMP     Component Value Date/Time   NA 139 01/22/2015 1448   K 4.2 01/22/2015 1448   CL 108 01/22/2015 1448   CO2 26 01/22/2015 1448   GLUCOSE 139* 01/22/2015 1448   BUN 17 01/22/2015 1448   CREATININE 0.46* 01/26/2015 1153   CALCIUM 9.0  01/22/2015 1448   PROT 5.7* 03/10/2014 0400   ALBUMIN 2.8* 03/10/2014 0400   AST 16 03/10/2014 0400   ALT 17 03/10/2014 0400   ALKPHOS 122* 03/10/2014 0400   BILITOT 0.4 03/10/2014 0400   GFRNONAA 84* 01/26/2015 1153   GFRAA >90 01/26/2015 1153     Impression:  The patient is tolerating radiotherapy.   Plan:  Continue radiotherapy as planned.   -----------------------------------  Eppie Gibson, MD

## 2015-08-18 ENCOUNTER — Ambulatory Visit
Admission: RE | Admit: 2015-08-18 | Discharge: 2015-08-18 | Disposition: A | Discharge status: Home / Self Care | Payer: Medicare Other | Source: Ambulatory Visit | Attending: Radiation Oncology | Admitting: Radiation Oncology

## 2015-08-18 DIAGNOSIS — Z510 Encounter for antineoplastic radiation therapy: Secondary | ICD-10-CM | POA: Diagnosis not present

## 2015-08-19 ENCOUNTER — Ambulatory Visit
Admission: RE | Admit: 2015-08-19 | Discharge: 2015-08-19 | Disposition: A | Discharge status: Home / Self Care | Payer: Medicare Other | Source: Ambulatory Visit | Attending: Radiation Oncology | Admitting: Radiation Oncology

## 2015-08-19 DIAGNOSIS — Z510 Encounter for antineoplastic radiation therapy: Secondary | ICD-10-CM | POA: Diagnosis not present

## 2015-08-20 ENCOUNTER — Ambulatory Visit
Admission: RE | Admit: 2015-08-20 | Discharge: 2015-08-20 | Disposition: A | Discharge status: Home / Self Care | Payer: Medicare Other | Source: Ambulatory Visit | Attending: Radiation Oncology | Admitting: Radiation Oncology

## 2015-08-20 DIAGNOSIS — Z510 Encounter for antineoplastic radiation therapy: Secondary | ICD-10-CM | POA: Diagnosis not present

## 2015-08-21 ENCOUNTER — Ambulatory Visit
Admission: RE | Admit: 2015-08-21 | Discharge: 2015-08-21 | Disposition: A | Discharge status: Home / Self Care | Payer: Medicare Other | Source: Ambulatory Visit | Attending: Radiation Oncology | Admitting: Radiation Oncology

## 2015-08-21 DIAGNOSIS — C50119 Malignant neoplasm of central portion of unspecified female breast: Secondary | ICD-10-CM

## 2015-08-21 DIAGNOSIS — Z510 Encounter for antineoplastic radiation therapy: Secondary | ICD-10-CM | POA: Diagnosis not present

## 2015-08-21 LAB — CBC WITH DIFFERENTIAL/PLATELET
BASO%: 1.3 % (ref 0.0–2.0)
BASOS ABS: 0.1 10*3/uL (ref 0.0–0.1)
EOS%: 1.8 % (ref 0.0–7.0)
Eosinophils Absolute: 0.1 10*3/uL (ref 0.0–0.5)
HEMATOCRIT: 34.4 % — AB (ref 34.8–46.6)
HEMOGLOBIN: 11.1 g/dL — AB (ref 11.6–15.9)
LYMPH#: 0.8 10*3/uL — AB (ref 0.9–3.3)
LYMPH%: 11.5 % — ABNORMAL LOW (ref 14.0–49.7)
MCH: 28 pg (ref 25.1–34.0)
MCHC: 32.4 g/dL (ref 31.5–36.0)
MCV: 86.4 fL (ref 79.5–101.0)
MONO#: 1 10*3/uL — ABNORMAL HIGH (ref 0.1–0.9)
MONO%: 14.1 % — ABNORMAL HIGH (ref 0.0–14.0)
NEUT#: 4.9 10*3/uL (ref 1.5–6.5)
NEUT%: 71.3 % (ref 38.4–76.8)
Platelets: 289 10*3/uL (ref 145–400)
RBC: 3.98 10*6/uL (ref 3.70–5.45)
RDW: 16.4 % — AB (ref 11.2–14.5)
WBC: 6.9 10*3/uL (ref 3.9–10.3)

## 2015-08-24 ENCOUNTER — Ambulatory Visit
Admission: RE | Admit: 2015-08-24 | Discharge: 2015-08-24 | Disposition: A | Discharge status: Home / Self Care | Payer: Medicare Other | Source: Ambulatory Visit | Attending: Radiation Oncology | Admitting: Radiation Oncology

## 2015-08-24 VITALS — BP 127/81 | HR 84 | Temp 98.0°F | Ht 61.0 in | Wt 119.4 lb

## 2015-08-24 DIAGNOSIS — Z91048 Other nonmedicinal substance allergy status: Secondary | ICD-10-CM | POA: Diagnosis not present

## 2015-08-24 DIAGNOSIS — C50112 Malignant neoplasm of central portion of left female breast: Secondary | ICD-10-CM

## 2015-08-24 DIAGNOSIS — C50119 Malignant neoplasm of central portion of unspecified female breast: Secondary | ICD-10-CM | POA: Diagnosis present

## 2015-08-24 DIAGNOSIS — K639 Disease of intestine, unspecified: Secondary | ICD-10-CM | POA: Diagnosis not present

## 2015-08-24 DIAGNOSIS — Z880 Allergy status to penicillin: Secondary | ICD-10-CM | POA: Diagnosis not present

## 2015-08-24 DIAGNOSIS — Z510 Encounter for antineoplastic radiation therapy: Secondary | ICD-10-CM | POA: Diagnosis not present

## 2015-08-24 NOTE — Progress Notes (Signed)
[name] presents for her 17th fraction to her left breast. She reports a good appetite, but does report some mild fatigue but able to do her normal activities. Her left breast has mild redness, and she is using her radiaplex as directed.  BP 127/81 mmHg  Pulse 84  Temp(Src) 98 F (36.7 C)  Ht 5\' 1"  (1.549 m)  Wt 119 lb 6.4 oz (54.159 kg)  BMI 22.57 kg/m2  Wt Readings from Last 3 Encounters:  08/24/15 119 lb 6.4 oz (54.159 kg)  08/17/15 118 lb 3.2 oz (53.615 kg)  08/10/15 121 lb (54.885 kg)

## 2015-08-24 NOTE — Progress Notes (Signed)
   Weekly Management Note:  Outpatient    ICD-9-CM ICD-10-CM   1. Malignant neoplasm of central portion of left female breast (HCC) 174.1 C50.112     Current Dose:  42.5 Gy  Projected Dose: 50 Gy   Narrative:  The patient presents for routine under treatment assessment.  CBCT/MVCT images/Port film x-rays were reviewed.  The chart was checked. Doing well. Taking xeloda, no complaints  Physical Findings:  Wt Readings from Last 3 Encounters:  08/24/15 119 lb 6.4 oz (54.159 kg)  08/17/15 118 lb 3.2 oz (53.615 kg)  08/10/15 121 lb (54.885 kg)    height is 5\' 1"  (1.549 m) and weight is 119 lb 6.4 oz (54.159 kg). Her temperature is 98 F (36.7 C). Her blood pressure is 127/81 and her pulse is 84.  mild erythema over left breast,  erythematous nodule is flatter but not significantly changed in size. No new lesions. Skin intact.  CBC    Component Value Date/Time   WBC 6.9 08/21/2015 0945   WBC 5.1 01/26/2015 1153   RBC 3.98 08/21/2015 0945   RBC 3.61* 01/26/2015 1153   HGB 11.1* 08/21/2015 0945   HGB 11.0* 01/26/2015 1153   HCT 34.4* 08/21/2015 0945   HCT 32.8* 01/26/2015 1153   PLT 289 08/21/2015 0945   PLT 201 01/26/2015 1153   MCV 86.4 08/21/2015 0945   MCV 90.9 01/26/2015 1153   MCH 28.0 08/21/2015 0945   MCH 30.5 01/26/2015 1153   MCHC 32.4 08/21/2015 0945   MCHC 33.5 01/26/2015 1153   RDW 16.4* 08/21/2015 0945   RDW 16.9* 01/26/2015 1153   LYMPHSABS 0.8* 08/21/2015 0945   LYMPHSABS 2.2 03/07/2012 1202   MONOABS 1.0* 08/21/2015 0945   MONOABS 0.4 03/07/2012 1202   EOSABS 0.1 08/21/2015 0945   EOSABS 0.2 03/07/2012 1202   BASOSABS 0.1 08/21/2015 0945   BASOSABS 0.0 03/07/2012 1202     CMP     Component Value Date/Time   NA 139 01/22/2015 1448   K 4.2 01/22/2015 1448   CL 108 01/22/2015 1448   CO2 26 01/22/2015 1448   GLUCOSE 139* 01/22/2015 1448   BUN 17 01/22/2015 1448   CREATININE 0.46* 01/26/2015 1153   CALCIUM 9.0 01/22/2015 1448   PROT 5.7* 03/10/2014  0400   ALBUMIN 2.8* 03/10/2014 0400   AST 16 03/10/2014 0400   ALT 17 03/10/2014 0400   ALKPHOS 122* 03/10/2014 0400   BILITOT 0.4 03/10/2014 0400   GFRNONAA 84* 01/26/2015 1153   GFRAA >90 01/26/2015 1153     Impression:  The patient is tolerating radiotherapy.   Plan:  Continue radiotherapy as planned. F/u in 66mo. Sees med/onc Oct 11  -----------------------------------  Eppie Gibson, MD

## 2015-08-25 ENCOUNTER — Ambulatory Visit
Admission: RE | Admit: 2015-08-25 | Discharge: 2015-08-25 | Disposition: A | Discharge status: Home / Self Care | Payer: Medicare Other | Source: Ambulatory Visit | Attending: Radiation Oncology | Admitting: Radiation Oncology

## 2015-08-25 DIAGNOSIS — Z510 Encounter for antineoplastic radiation therapy: Secondary | ICD-10-CM | POA: Diagnosis not present

## 2015-08-26 ENCOUNTER — Ambulatory Visit
Admission: RE | Admit: 2015-08-26 | Discharge: 2015-08-26 | Disposition: A | Discharge status: Home / Self Care | Payer: Medicare Other | Source: Ambulatory Visit | Attending: Radiation Oncology | Admitting: Radiation Oncology

## 2015-08-26 DIAGNOSIS — Z510 Encounter for antineoplastic radiation therapy: Secondary | ICD-10-CM | POA: Diagnosis not present

## 2015-08-27 ENCOUNTER — Ambulatory Visit
Admission: RE | Admit: 2015-08-27 | Discharge: 2015-08-27 | Disposition: A | Discharge status: Home / Self Care | Payer: Medicare Other | Source: Ambulatory Visit | Attending: Radiation Oncology | Admitting: Radiation Oncology

## 2015-08-27 DIAGNOSIS — Z510 Encounter for antineoplastic radiation therapy: Secondary | ICD-10-CM | POA: Diagnosis not present

## 2015-08-28 ENCOUNTER — Inpatient Hospital Stay: Admission: RE | Admit: 2015-08-28 | Payer: Medicare Other | Source: Ambulatory Visit | Admitting: Radiation Oncology

## 2015-08-28 ENCOUNTER — Ambulatory Visit
Admission: RE | Admit: 2015-08-28 | Discharge: 2015-08-28 | Disposition: A | Discharge status: Home / Self Care | Payer: Medicare Other | Source: Ambulatory Visit | Attending: Radiation Oncology | Admitting: Radiation Oncology

## 2015-08-28 ENCOUNTER — Encounter: Payer: Self-pay | Admitting: Radiation Oncology

## 2015-08-28 ENCOUNTER — Ambulatory Visit: Admission: RE | Admit: 2015-08-28 | Payer: Medicare Other | Source: Ambulatory Visit | Admitting: Radiation Oncology

## 2015-08-28 NOTE — Progress Notes (Signed)
  Radiation Oncology         [phone] ________________________________  Name: [name] MRN: [id number]  Date: 08/28/2015  DOB: 02/02/1920  End of Treatment Note  Diagnosis: Triple Negative Right breast cancer T4bN0M0 Stage III   , satellite lesion to left breast  Indication for treatment:  palliative with Xeloda  Radiation treatment dates:   08/07/15- 08/27/15       Site/dose:    1) Left breast surface (skin), 35 Gy in 14 fractions 2) Left breast boost to gross tumor, 15 Gy in 6 fractions  Beams/energy:    1) Electron [employer], 6 MeV 2) Electron [employer], 6 MeV  Narrative: The patient tolerated radiation treatment relatively well.    Plan: The patient has completed radiation treatment. The patient will return to radiation oncology clinic for routine followup in one month. I advised them to call or return sooner if they have any questions or concerns related to their recovery or treatment.  -----------------------------------  Eppie Gibson, MD   This document serves as a record of services personally performed by Eppie Gibson, MD. It was created on her behalf by Derek Mound, a trained medical scribe. The creation of this record is based on the scribe's personal observations and the provider's statements to them. This document has been checked and approved by the attending provider.

## 2015-08-28 NOTE — Progress Notes (Incomplete)
°  Radiation Oncology         [phone] ________________________________  Name: [name] MRN: [id number]  Date: 08/28/2015  DOB: 1920-07-30  End of Treatment Note  Diagnosis: Triple Negative Right breast cancer T4bN0M0 Stage III     Indication for treatment:  ***    Radiation treatment dates:   08/07/15- 08/27/15       Site/dose:    1) Left breast, 35 Gy in 14 fractions 2) Left breast boost, 15 Gy in 6 fractions  Beams/energy:    1) Electron [employer], 6 MeV 2) Electron [employer], 6 MeV  Narrative: The patient tolerated radiation treatment relatively well.   ***  Plan: The patient has completed radiation treatment. The patient will return to radiation oncology clinic for routine followup in one month. I advised them to call or return sooner if they have any questions or concerns related to their recovery or treatment.  -----------------------------------  Eppie Gibson, MD   This document serves as a record of services personally performed by Eppie Gibson, MD. It was created on her behalf by Derek Mound, a trained medical scribe. The creation of this record is based on the scribe's personal observations and the provider's statements to them. This document has been checked and approved by the attending provider.

## 2015-09-03 ENCOUNTER — Other Ambulatory Visit: Payer: Self-pay | Admitting: Adult Health

## 2015-09-03 DIAGNOSIS — C50911 Malignant neoplasm of unspecified site of right female breast: Secondary | ICD-10-CM

## 2015-10-01 ENCOUNTER — Emergency Department (HOSPITAL_COMMUNITY): Payer: Medicare Other

## 2015-10-01 ENCOUNTER — Encounter (HOSPITAL_COMMUNITY): Payer: Self-pay

## 2015-10-01 ENCOUNTER — Emergency Department (HOSPITAL_COMMUNITY)
Admission: EM | Admit: 2015-10-01 | Discharge: 2015-10-01 | Disposition: A | Discharge status: Home / Self Care | Payer: Medicare Other | Attending: Emergency Medicine | Admitting: Emergency Medicine

## 2015-10-01 DIAGNOSIS — Y998 Other external cause status: Secondary | ICD-10-CM | POA: Insufficient documentation

## 2015-10-01 DIAGNOSIS — Y9389 Activity, other specified: Secondary | ICD-10-CM | POA: Diagnosis not present

## 2015-10-01 DIAGNOSIS — Z862 Personal history of diseases of the blood and blood-forming organs and certain disorders involving the immune mechanism: Secondary | ICD-10-CM | POA: Insufficient documentation

## 2015-10-01 DIAGNOSIS — M1990 Unspecified osteoarthritis, unspecified site: Secondary | ICD-10-CM | POA: Insufficient documentation

## 2015-10-01 DIAGNOSIS — Z853 Personal history of malignant neoplasm of breast: Secondary | ICD-10-CM | POA: Diagnosis not present

## 2015-10-01 DIAGNOSIS — Z8619 Personal history of other infectious and parasitic diseases: Secondary | ICD-10-CM | POA: Insufficient documentation

## 2015-10-01 DIAGNOSIS — J90 Pleural effusion, not elsewhere classified: Secondary | ICD-10-CM | POA: Diagnosis not present

## 2015-10-01 DIAGNOSIS — Z8542 Personal history of malignant neoplasm of other parts of uterus: Secondary | ICD-10-CM | POA: Insufficient documentation

## 2015-10-01 DIAGNOSIS — Z8673 Personal history of transient ischemic attack (TIA), and cerebral infarction without residual deficits: Secondary | ICD-10-CM | POA: Diagnosis not present

## 2015-10-01 DIAGNOSIS — Z8701 Personal history of pneumonia (recurrent): Secondary | ICD-10-CM | POA: Diagnosis not present

## 2015-10-01 DIAGNOSIS — Z87440 Personal history of urinary (tract) infections: Secondary | ICD-10-CM | POA: Insufficient documentation

## 2015-10-01 DIAGNOSIS — Z8611 Personal history of tuberculosis: Secondary | ICD-10-CM | POA: Diagnosis not present

## 2015-10-01 DIAGNOSIS — S20211A Contusion of right front wall of thorax, initial encounter: Secondary | ICD-10-CM | POA: Diagnosis not present

## 2015-10-01 DIAGNOSIS — Z79899 Other long term (current) drug therapy: Secondary | ICD-10-CM | POA: Insufficient documentation

## 2015-10-01 DIAGNOSIS — Z7982 Long term (current) use of aspirin: Secondary | ICD-10-CM | POA: Insufficient documentation

## 2015-10-01 DIAGNOSIS — Z880 Allergy status to penicillin: Secondary | ICD-10-CM | POA: Diagnosis not present

## 2015-10-01 DIAGNOSIS — Y9289 Other specified places as the place of occurrence of the external cause: Secondary | ICD-10-CM | POA: Insufficient documentation

## 2015-10-01 DIAGNOSIS — S32019A Unspecified fracture of first lumbar vertebra, initial encounter for closed fracture: Secondary | ICD-10-CM

## 2015-10-01 DIAGNOSIS — Z923 Personal history of irradiation: Secondary | ICD-10-CM | POA: Diagnosis not present

## 2015-10-01 DIAGNOSIS — S32028A Other fracture of second lumbar vertebra, initial encounter for closed fracture: Secondary | ICD-10-CM | POA: Diagnosis not present

## 2015-10-01 DIAGNOSIS — K5900 Constipation, unspecified: Secondary | ICD-10-CM | POA: Diagnosis not present

## 2015-10-01 DIAGNOSIS — S29002A Unspecified injury of muscle and tendon of back wall of thorax, initial encounter: Secondary | ICD-10-CM | POA: Insufficient documentation

## 2015-10-01 DIAGNOSIS — S32018A Other fracture of first lumbar vertebra, initial encounter for closed fracture: Secondary | ICD-10-CM | POA: Diagnosis not present

## 2015-10-01 DIAGNOSIS — W01198A Fall on same level from slipping, tripping and stumbling with subsequent striking against other object, initial encounter: Secondary | ICD-10-CM | POA: Diagnosis not present

## 2015-10-01 DIAGNOSIS — S32029A Unspecified fracture of second lumbar vertebra, initial encounter for closed fracture: Secondary | ICD-10-CM

## 2015-10-01 DIAGNOSIS — R52 Pain, unspecified: Secondary | ICD-10-CM

## 2015-10-01 DIAGNOSIS — Z9221 Personal history of antineoplastic chemotherapy: Secondary | ICD-10-CM | POA: Diagnosis not present

## 2015-10-01 DIAGNOSIS — S3992XA Unspecified injury of lower back, initial encounter: Secondary | ICD-10-CM | POA: Diagnosis present

## 2015-10-01 HISTORY — DX: Malignant neoplasm of unspecified site of unspecified female breast: C50.919

## 2015-10-01 LAB — CBC WITH DIFFERENTIAL/PLATELET
Basophils Absolute: 0 10*3/uL (ref 0.0–0.1)
Basophils Relative: 0 %
Eosinophils Absolute: 0.1 10*3/uL (ref 0.0–0.7)
Eosinophils Relative: 1 %
HCT: 31.6 % — ABNORMAL LOW (ref 36.0–46.0)
HEMOGLOBIN: 10.1 g/dL — AB (ref 12.0–15.0)
LYMPHS ABS: 0.7 10*3/uL (ref 0.7–4.0)
LYMPHS PCT: 6 %
MCH: 27.1 pg (ref 26.0–34.0)
MCHC: 32 g/dL (ref 30.0–36.0)
MCV: 84.7 fL (ref 78.0–100.0)
MONOS PCT: 8 %
Monocytes Absolute: 1 10*3/uL (ref 0.1–1.0)
NEUTROS ABS: 11.1 10*3/uL — AB (ref 1.7–7.7)
NEUTROS PCT: 85 %
Platelets: 325 10*3/uL (ref 150–400)
RBC: 3.73 MIL/uL — ABNORMAL LOW (ref 3.87–5.11)
RDW: 17.6 % — ABNORMAL HIGH (ref 11.5–15.5)
WBC: 13 10*3/uL — ABNORMAL HIGH (ref 4.0–10.5)

## 2015-10-01 LAB — I-STAT TROPONIN, ED: Troponin i, poc: 0 ng/mL (ref 0.00–0.08)

## 2015-10-01 LAB — COMPREHENSIVE METABOLIC PANEL
ALK PHOS: 100 U/L (ref 38–126)
ALT: 16 U/L (ref 14–54)
ANION GAP: 6 (ref 5–15)
AST: 25 U/L (ref 15–41)
Albumin: 3.2 g/dL — ABNORMAL LOW (ref 3.5–5.0)
BUN: 15 mg/dL (ref 6–20)
CO2: 26 mmol/L (ref 22–32)
Calcium: 8.7 mg/dL — ABNORMAL LOW (ref 8.9–10.3)
Chloride: 101 mmol/L (ref 101–111)
Creatinine, Ser: 0.56 mg/dL (ref 0.44–1.00)
GFR calc Af Amer: >60 mL/min (ref >60)
GFR calc non Af Amer: >60 mL/min (ref >60)
Glucose, Bld: 148 mg/dL — ABNORMAL HIGH (ref 65–99)
POTASSIUM: 4.4 mmol/L (ref 3.5–5.1)
Sodium: 133 mmol/L — ABNORMAL LOW (ref 135–145)
Total Bilirubin: 0.4 mg/dL (ref 0.3–1.2)
Total Protein: 6.3 g/dL — ABNORMAL LOW (ref 6.5–8.1)

## 2015-10-01 MED ORDER — IOHEXOL 300 MG/ML  SOLN
100.0000 mL | Freq: Once | INTRAMUSCULAR | Status: AC | PRN
  Administered 2015-10-01: 80 mL via INTRAVENOUS

## 2015-10-01 MED ORDER — HYDROCODONE-ACETAMINOPHEN 5-325 MG PO TABS
2.0000 | ORAL_TABLET | Freq: Once | ORAL | Status: AC
  Administered 2015-10-01: 2 via ORAL
  Filled 2015-10-01: qty 2

## 2015-10-01 MED ORDER — HYDROCODONE-ACETAMINOPHEN 5-325 MG PO TABS: 1.0000 | ORAL_TABLET | Freq: Four times a day (QID) | ORAL | Status: DC | PRN

## 2015-10-01 NOTE — ED Notes (Signed)
MD at bedside. 

## 2015-10-01 NOTE — Discharge Instructions (Signed)
Take vicodin as needed for pain.   See your doctor.   You have effusion in your lung. Get repeat xray and follow up with oncology.   Return to ER if you have worse pain, weakness, unable to walk, trouble breathing, fever, shortness of breath.

## 2015-10-01 NOTE — ED Notes (Signed)
Per EMS, pt from home.  Pt was making breakfast this am and tripped.  Pt fell on coffee table striking back.  No LOC.  No head injury.  No blood thinners.  Pt with noticeable redness/swelling to back.  Vitals: 112/68, hr 74, resp 18,

## 2015-10-01 NOTE — ED Notes (Signed)
Bed: [id number] Expected date:  Expected time:  Means of arrival:  Comments: EMS 81 yo female from home, tripped and fell this AM, cancer patient, c/o lower back pain

## 2015-10-01 NOTE — ED Provider Notes (Signed)
CSN: [id number]     Arrival date & time 10/01/15  0714 History   First MD Initiated Contact with Patient 10/01/15 [phone]     Chief Complaint  Patient presents with  . Fall  . Back Pain     (Consider location/radiation/quality/duration/timing/severity/associated sxs/prior Treatment) The history is provided by the patient.  [name] is a 81 y.o. female history of breast cancer with last chemotherapy and radiation a month ago, who presented with fall. She was making breakfast this morning and tripped and fell backwards and hit the right upper back on the coffee table. Denies any head injury or neck pain. States that her back and right ribs hurt afterwards. Denies any trouble breathing or chest pain. Denies any abdominal pain. Denies any weakness or numbness. Patient not on blood thinners.    Past Medical History  Diagnosis Date  . History of shingles 02/16/12  . History of hip fracture   . Hx of Clostridium difficile infection     May/June 2015  . Difficult intubation     difficulty with 1st 2 breast cancers in the late 1980's in [place]  . Pneumonia     as a child  . History of recurrent UTIs   . Anemia   . Hx of transfusion of packed red blood cells   . Arthritis   . H/O fracture of hip     left  . Anemia     while in hospital with broken hip  . Stroke [facility])     ? TIA   . Tuberculosis     as a child  . Constipation   . Complication of anesthesia     had one time where they could not get her under anesthesia  . Shingles   . History of chemotherapy 11/04/14- 01/13/15    Dr Tressie Stalker, [place]  . uterine ca dx'd 1998     [facility] CA of uterus I  . Cancer ([place])     Breast cancer X 2  . Breast cancer ([place]) 10/15/14    right, triple neg  . Breast CA ([place]) dx'd 12/2014    left   Past Surgical History  Procedure Laterality Date  . Appendectomy    . Abdominal hysterectomy      TAH BSO  . Shoulder surgery Left     has titanium ball in shoulder   . Cataract  extraction    . Hemorrhoid surgery    . Breast surgery      Lumpectomy right and left breast-Radiation and Tamoxfen  . Oophorectomy      BSO  . Eye surgery Bilateral     cataract surgery w/ lens implant  . Fracture surgery Left     hip  . Portacath placement Left 10/31/2014    Procedure: INSERTION PORT-A-CATH WITH ULTRA SOUND ;  Surgeon: Fanny Skates, MD;  Location: [place];  Service: General;  Laterality: Left;  . Lipoma excision Right 10/31/2014    Procedure: EXCISION 2 SKIN LESIONS RIGHT CHEST WALL;  Surgeon: Fanny Skates, MD;  Location: [place];  Service: General;  Laterality: Right;  . Fracture surgery      left hip  . Colonoscopy    . Hemorroidectomy      x 2  . Eye surgery Bilateral     cataract surgery  . Tonsillectomy    . Abdominal hysterectomy    . Breast surgery Bilateral     lumpectomy  . Shoulder surgery Left   . Port a  cath insertion    . Mastectomy Right 01/26/2015    DR Dalbert Batman  . Total mastectomy Right 01/26/2015    Procedure: RIGHT TOTAL MASTECTOMY;  Surgeon: Fanny Skates, MD;  Location: [place];  Service: General;  Laterality: Right;   Family History  Problem Relation Age of Onset  . Hypertension Mother   . Heart disease Mother   . CVA Mother   . Hypertension Father   . Heart disease Father   . Lung disease Father   . Cancer Father     stomach  . Breast cancer Sister     age 92  . Hypertension Sister   . Diabetes Brother   . Hypertension Brother   . Heart disease Brother    Social History  Substance Use Topics  . Smoking status: Never Smoker   . Smokeless tobacco: Never Used  . Alcohol Use: 4.2 oz/week    7 Glasses of wine, 7 Standard drinks or equivalent per week   OB History    Gravida Para Term Preterm AB TAB SAB Ectopic Multiple Living   3 3 3  0 0 0 0 0       Review of Systems  Musculoskeletal: Positive for back pain.  All other systems reviewed and are negative.     Allergies  Penicillins and Tape  Home Medications   Prior  to Admission medications   Medication Sig Start Date End Date Taking? Authorizing Provider  aspirin 81 MG tablet Take 81 mg by mouth daily.   Yes Historical Provider, MD  Cholecalciferol (VITAMIN D3) 5000 UNITS CAPS Take 1 capsule by mouth daily.   Yes Historical Provider, MD  Cranberry 475 MG CAPS Take 1 capsule by mouth daily.   Yes Historical Provider, MD  hyaluronate sodium (RADIAPLEXRX) GEL Apply 1 application topically once.   Yes Historical Provider, MD  lidocaine-prilocaine (EMLA) cream Apply 1 application topically as needed (for port access).  11/04/14  Yes Historical Provider, MD  Multiple Vitamins-Minerals (CENTRUM SILVER ULTRA WOMENS PO) Take 1 tablet by mouth daily.   Yes Historical Provider, MD  Probiotic Product (PROBIOTIC DAILY PO) Take 1 capsule by mouth daily.   Yes Historical Provider, MD  zolpidem (AMBIEN) 5 MG tablet Take 1 tablet by mouth at bedtime.  01/27/15  Yes Historical Provider, MD  oxyCODONE (OXY IR/ROXICODONE) 5 MG immediate release tablet Take 1-2 tablets q 4hrs PRN pain. Patient not taking: Reported on 10/01/2015 04/27/15   Eppie Gibson, MD  senna-docusate (SENOKOT-S) 8.6-50 MG per tablet Take 1 tablet by mouth daily. Take on days that you are taking oxycodone. Stop if you have diarrhea. Patient not taking: Reported on 10/01/2015 04/27/15   Eppie Gibson, MD   BP 123/62 mmHg  Pulse 76  Temp(Src) 97.7 F (36.5 C) (Oral)  Resp 18  SpO2 96% Physical Exam  Constitutional: She is oriented to person, place, and time.  Uncomfortable   HENT:  Head: Normocephalic and atraumatic.  Mouth/Throat: Oropharynx is clear and moist.  Eyes: Conjunctivae are normal. Pupils are equal, round, and reactive to light.  Neck: Normal range of motion. Neck supple.  Cardiovascular: Normal rate, regular rhythm and normal heart sounds.   Pulmonary/Chest: Effort normal and breath sounds normal. No respiratory distress. She has no wheezes. She has no rales.  + tenderness R lower ribs with  hematoma.   Abdominal: Soft. Bowel sounds are normal. She exhibits no distension. There is no tenderness. There is no rebound.  Musculoskeletal:  R parathoracic tenderness   Neurological: She  is alert and oriented to person, place, and time.  Neurovascular intact, no saddle anesthesia, nl strength throughout   Skin: Skin is warm and dry.  Psychiatric: She has a normal mood and affect. Her behavior is normal. Judgment and thought content normal.  Nursing note and vitals reviewed.   ED Course  Procedures (including critical care time) Labs Review Labs Reviewed  CBC WITH DIFFERENTIAL/PLATELET - Abnormal; Notable for the following:    WBC 13.0 (*)    RBC 3.73 (*)    Hemoglobin 10.1 (*)    HCT 31.6 (*)    RDW 17.6 (*)    Neutro Abs 11.1 (*)    All other components within normal limits  COMPREHENSIVE METABOLIC PANEL - Abnormal; Notable for the following:    Sodium 133 (*)    Glucose, Bld 148 (*)    Calcium 8.7 (*)    Total Protein 6.3 (*)    Albumin 3.2 (*)    All other components within normal limits  I-STAT TROPOININ, ED    Imaging Review Dg Ribs Unilateral W/chest Right  10/01/2015  CLINICAL DATA:  Fall today. Landed on back. Swelling bruising. Rib injury. EXAM: RIGHT RIBS AND CHEST - 3+ VIEW COMPARISON:  PET scan 07/21/2015. FINDINGS: The heart is mildly enlarged. A left subclavian Port-A-Cath is in place. Bilateral axillary node dissections are present. A left shoulder surgery is present. Right basilar airspace disease and effusion are again noted. This is somewhat increased from a prior exam. The left base is clear. Dedicated images of the right ribs demonstrate no acute or healing fracture. Scoliosis of thoracolumbar spine is noted. IMPRESSION: 1. Small right pleural effusion and associated airspace disease slightly increased from the prior exam. While this likely represents atelectasis, early infection is not excluded. Pulmonary contusion is less likely. 2. No acute or healing  rib fractures. Electronically Signed   By: San Morelle M.D.   On: 10/01/2015 08:23   Dg Thoracic Spine 2 View  10/01/2015  CLINICAL DATA:  Pain following fall EXAM: THORACIC SPINE 3 VIEWS COMPARISON:  None. FINDINGS: Frontal, lateral, and swimmer's views were obtained. The there is mid thoracic levoscoliosis with thoracolumbar dextroscoliosis. There is remodeling of several vertebral bodies due to chronic scoliosis. There is endplate concavity at QA348G, T11, and T12, likely due to chronic osteoporosis and remodeling from the scoliosis. There is mild anterior wedging of the T3, T4, and T9 vertebral bodies, age uncertain but likely chronic. No spondylolisthesis. There is disc space narrowing at essentially all levels. Port-A-Cath tip is at the cavoatrial junction. There is a left total shoulder replacement. There is soft tissue prominence posterior to the mid thoracic region, possibly hematoma given history of fall. IMPRESSION: Scoliosis with multilevel arthropathy and osteoporosis. Probable chronic areas of endplate concavity and anterior wedging as noted above. It is difficult to in this circumstance to exclude acute bony injury ; if symptoms so warrant, thoracic MR to assess for marrow edema would be the imaging study of choice for further assessment. Soft tissue prominence posterior to the mid thoracic region may represent hematoma in the acute trauma setting. Electronically Signed   By: Lowella Grip III M.D.   On: 10/01/2015 08:21   Ct Chest W Contrast  10/01/2015  CLINICAL DATA:  Fall this morning. It coffee table with back. Low back pain. Personal history breast cancer. Abnormal PET scan suggesting possibility of colon cancer. EXAM: CT CHEST, ABDOMEN, AND PELVIS WITH CONTRAST TECHNIQUE: Multidetector CT imaging of the chest, abdomen and pelvis  was performed following the standard protocol during bolus administration of intravenous contrast. CONTRAST:  24mL OMNIPAQUE IOHEXOL 300 MG/ML  SOLN  COMPARISON:  Chest x-ray from the same day.  PET scan 07/21/2015. FINDINGS: CT CHEST FINDINGS Mediastinum/Lymph Nodes: Mitral valve annular calcifications are present. A left subclavian Port-A-Cath is in place. Heart size is normal. No significant pericardial effusion is present. Lungs/Pleura: A moderate-sized right pleural effusion is present. There is some associated atelectasis. Mild peripheral reticular disease is noted in the air right middle lobe as well. There is minimal dependent atelectasis on the left. No other focal nodule, mass, or airspace disease is present. Musculoskeletal: Dedicated reformats of the thoracic spine demonstrate levoconvex scoliosis. Vertebral body heights and alignment are maintained. There is no acute fracture. CT ABDOMEN PELVIS FINDINGS Hepatobiliary: Mild intrahepatic biliary dilation is present. The gallbladder is mildly distended with layering sludge. The common bile duct is unremarkable. No mass lesion is present. Pancreas: Within normal limits Spleen: Normal Adrenals/Urinary Tract: Adrenal glands are within normal limits bilaterally. Subcentimeter cysts are present in both kidneys. The punctate nonobstructing stone is noted at the lower pole of the left kidney. The ureters are within normal limits bilaterally. The urinary bladder is unremarkable. Stomach/Bowel: A small hiatal hernia is present. Stomach and duodenum are otherwise within normal limits. Small bowel is unremarkable. Moderate stool is present throughout the colon. Vascular/Lymphatic: No significant adenopathy is present. There are small mesenteric lymph nodes. Atherosclerotic calcifications are present in the aorta without aneurysm. Reproductive: Within normal limits Other: Musculoskeletal: Dedicated reformatted images of the a lumbar spine reveal nondisplaced right transverse process fractures at L1 and L2. Scoliosis is convex to the right at L2. Vertebral body heights and alignment are maintained. Multilevel facet  degenerative changes are present. There is a soft tissue contusion within the subcutaneous tissues posterior and to the right of L1 and L2. IMPRESSION: 1. Soft tissue contusion and nondisplaced right transverse fractures at L1 and L2. 2. No other acute fractures or soft tissue trauma. 3. Scoliosis. 4. Moderate right pleural effusion and associated atelectasis. Punctate nonobstructing stone at the lower pole 5. Punctate nonobstructing stone at the lower pole of the left kidney. 6. Small hiatal hernia. Electronically Signed   By: San Morelle M.D.   On: 10/01/2015 12:19   Ct Abdomen Pelvis W Contrast  10/01/2015  CLINICAL DATA:  Fall this morning. It coffee table with back. Low back pain. Personal history breast cancer. Abnormal PET scan suggesting possibility of colon cancer. EXAM: CT CHEST, ABDOMEN, AND PELVIS WITH CONTRAST TECHNIQUE: Multidetector CT imaging of the chest, abdomen and pelvis was performed following the standard protocol during bolus administration of intravenous contrast. CONTRAST:  9mL OMNIPAQUE IOHEXOL 300 MG/ML  SOLN COMPARISON:  Chest x-ray from the same day.  PET scan 07/21/2015. FINDINGS: CT CHEST FINDINGS Mediastinum/Lymph Nodes: Mitral valve annular calcifications are present. A left subclavian Port-A-Cath is in place. Heart size is normal. No significant pericardial effusion is present. Lungs/Pleura: A moderate-sized right pleural effusion is present. There is some associated atelectasis. Mild peripheral reticular disease is noted in the air right middle lobe as well. There is minimal dependent atelectasis on the left. No other focal nodule, mass, or airspace disease is present. Musculoskeletal: Dedicated reformats of the thoracic spine demonstrate levoconvex scoliosis. Vertebral body heights and alignment are maintained. There is no acute fracture. CT ABDOMEN PELVIS FINDINGS Hepatobiliary: Mild intrahepatic biliary dilation is present. The gallbladder is mildly distended with  layering sludge. The common bile duct is unremarkable. No  mass lesion is present. Pancreas: Within normal limits Spleen: Normal Adrenals/Urinary Tract: Adrenal glands are within normal limits bilaterally. Subcentimeter cysts are present in both kidneys. The punctate nonobstructing stone is noted at the lower pole of the left kidney. The ureters are within normal limits bilaterally. The urinary bladder is unremarkable. Stomach/Bowel: A small hiatal hernia is present. Stomach and duodenum are otherwise within normal limits. Small bowel is unremarkable. Moderate stool is present throughout the colon. Vascular/Lymphatic: No significant adenopathy is present. There are small mesenteric lymph nodes. Atherosclerotic calcifications are present in the aorta without aneurysm. Reproductive: Within normal limits Other: Musculoskeletal: Dedicated reformatted images of the a lumbar spine reveal nondisplaced right transverse process fractures at L1 and L2. Scoliosis is convex to the right at L2. Vertebral body heights and alignment are maintained. Multilevel facet degenerative changes are present. There is a soft tissue contusion within the subcutaneous tissues posterior and to the right of L1 and L2. IMPRESSION: 1. Soft tissue contusion and nondisplaced right transverse fractures at L1 and L2. 2. No other acute fractures or soft tissue trauma. 3. Scoliosis. 4. Moderate right pleural effusion and associated atelectasis. Punctate nonobstructing stone at the lower pole 5. Punctate nonobstructing stone at the lower pole of the left kidney. 6. Small hiatal hernia. Electronically Signed   By: San Morelle M.D.   On: 10/01/2015 12:19   I have personally reviewed and evaluated these images and lab results as part of my medical decision-making.   EKG Interpretation   Date/Time:  Thursday October 01 2015 08:50:54 EST Ventricular Rate:  81 PR Interval:  156 QRS Duration: 72 QT Interval:  379 QTC Calculation: 440 R  Axis:   90 Text Interpretation:  Sinus rhythm Borderline right axis deviation No  significant change since last tracing Confirmed by YAO  MD, DAVID ([id number])  on 10/01/2015 8:53:05 AM      MDM   Final diagnoses:  Pain   AMALIE ASAY is a 81 y.o. female here with mechanical fall, R posterior rib pain. Will get xrays. Neurovascular intact so I doubt spinal cord compression.   1:25 PM Initial xray showed possible pneumonia vs hematoma. CT ordered and showed soft tissue contusion and nondisplaced L1 and L2 fractures. Has pleural effusion as well but no rib fractures. Pain controlled with vicodin. Walking with assistance (baseline), neurovascular intact. Consulted Dr. Astrid Drafts, who states that she will not need surgery or follow up for it. Don't know why she has pleural effusion, likely related to cancer as there are no rib fractures to suggest hemothorax and she is not hypoxic so will not need admission or workup in the ED. Will have patient follow up with oncology   Wandra Arthurs, MD 10/01/15 [phone]

## 2015-10-07 ENCOUNTER — Telehealth: Payer: Self-pay | Admitting: Adult Health

## 2015-10-07 NOTE — Telephone Encounter (Signed)
I called and spoke with her daughter and she declines at this time. States she doesn't remember getting a call but that she does have an appointment this week with Dr Isidore Moos.   Thanks anne  ----- Message -----  This was in an inbox message to [employer]

## 2015-10-09 ENCOUNTER — Encounter: Payer: Self-pay | Admitting: Radiation Oncology

## 2015-10-09 ENCOUNTER — Ambulatory Visit
Admission: RE | Admit: 2015-10-09 | Discharge: 2015-10-09 | Disposition: A | Discharge status: Home / Self Care | Payer: Medicare Other | Source: Ambulatory Visit | Attending: Radiation Oncology | Admitting: Radiation Oncology

## 2015-10-09 VITALS — BP 150/83 | HR 98 | Temp 98.2°F | Ht 61.0 in | Wt 119.5 lb

## 2015-10-09 DIAGNOSIS — C50112 Malignant neoplasm of central portion of left female breast: Secondary | ICD-10-CM

## 2015-10-09 NOTE — Progress Notes (Signed)
Radiation Oncology         [phone] ________________________________  Name: [name] MRN: [id number]  Date: 10/09/2015  DOB: [birth year]  Follow-Up Visit Note  Outpatient  CC: Geoffery Lyons, MD  Fanny Skates, MD  Diagnosis and Prior Radiotherapy:    ICD-9-CM ICD-10-CM   1. Malignant neoplasm of central portion of left female breast ([place]) 174.1 C50.112     Triple Negative Right breast cancer [id number] Stage III, satellite lesion to left breast  Narrative:  The patient returns today for routine follow-up. She reports that she fell backwards and hit her back on a glass coffee table last Thursday, 11/10. She went to the ED after this happened. CT revealed soft tissue contusion and nondisplaced right transverse fractures at L1 and L2.  The nurse notes that her lower back has purple bruises on both sides and the skin on her right chest wall and left breast has hyperpigmentation. She reports having fatigue. No bowel complaints.  ALLERGIES:  is allergic to penicillins and tape.  Meds: Current Outpatient Prescriptions  Medication Sig Dispense Refill  . aspirin 81 MG tablet Take 81 mg by mouth daily.    . Cholecalciferol (VITAMIN D3) 5000 UNITS CAPS Take 1 capsule by mouth daily.    . Cranberry 475 MG CAPS Take 1 capsule by mouth daily.    Marland Kitchen lidocaine-prilocaine (EMLA) cream Apply 1 application topically as needed (for port access).     . Multiple Vitamins-Minerals (CENTRUM SILVER ULTRA WOMENS PO) Take 1 tablet by mouth daily.    . Probiotic Product (PROBIOTIC DAILY PO) Take 1 capsule by mouth daily.    Marland Kitchen zolpidem (AMBIEN) 5 MG tablet Take 1 tablet by mouth at bedtime.     . hyaluronate sodium (RADIAPLEXRX) GEL Apply 1 application topically once.    Marland Kitchen HYDROcodone-acetaminophen (NORCO/VICODIN) 5-325 MG tablet Take 1-2 tablets by mouth every 6 (six) hours as needed. (Patient not taking: Reported on 10/09/2015) 10 tablet 0  . oxyCODONE (OXY IR/ROXICODONE) 5 MG immediate  release tablet Take 1-2 tablets q 4hrs PRN pain. (Patient not taking: Reported on 10/01/2015) 90 tablet 0  . senna-docusate (SENOKOT-S) 8.6-50 MG per tablet Take 1 tablet by mouth daily. Take on days that you are taking oxycodone. Stop if you have diarrhea. (Patient not taking: Reported on 10/01/2015) 30 tablet 0   No current facility-administered medications for this encounter.    Physical Findings: The patient is in no acute distress. Patient is alert and oriented.  height is 5\' 1"  (1.549 m) and weight is 119 lb 8 oz (54.205 kg). Her oral temperature is 98.2 F (36.8 C). Her blood pressure is 150/83 and her pulse is 98. .    The patient presents to the clinic with a walker. Ambulatory.  There is no sign of progressive or recurrent disease over the right chest wall or the left breast. The nodule over the lateral left breast is flattened and much less erythematous. Skin is hyperpigmented, but intact over chest.  Skin bruised over low back.  Swelling in right lateral lumbar region. Abdomen soft, nontender, nondistended.    Lab Findings: Lab Results  Component Value Date   WBC 13.0* 10/01/2015   HGB 10.1* 10/01/2015   HCT 31.6* 10/01/2015   MCV 84.7 10/01/2015   PLT 325 10/01/2015    Radiographic Findings: Dg Ribs Unilateral W/chest Right  10/01/2015  CLINICAL DATA:  Fall today. Landed on back. Swelling bruising. Rib injury. EXAM: RIGHT RIBS AND CHEST - 3+ VIEW COMPARISON:  PET scan 07/21/2015. FINDINGS: The heart is mildly enlarged. A left subclavian Port-A-Cath is in place. Bilateral axillary node dissections are present. A left shoulder surgery is present. Right basilar airspace disease and effusion are again noted. This is somewhat increased from a prior exam. The left base is clear. Dedicated images of the right ribs demonstrate no acute or healing fracture. Scoliosis of thoracolumbar spine is noted. IMPRESSION: 1. Small right pleural effusion and associated airspace disease slightly  increased from the prior exam. While this likely represents atelectasis, early infection is not excluded. Pulmonary contusion is less likely. 2. No acute or healing rib fractures. Electronically Signed   By: San Morelle M.D.   On: 10/01/2015 08:23   Dg Thoracic Spine 2 View  10/01/2015  CLINICAL DATA:  Pain following fall EXAM: THORACIC SPINE 3 VIEWS COMPARISON:  None. FINDINGS: Frontal, lateral, and swimmer's views were obtained. The there is mid thoracic levoscoliosis with thoracolumbar dextroscoliosis. There is remodeling of several vertebral bodies due to chronic scoliosis. There is endplate concavity at QA348G, T11, and T12, likely due to chronic osteoporosis and remodeling from the scoliosis. There is mild anterior wedging of the T3, T4, and T9 vertebral bodies, age uncertain but likely chronic. No spondylolisthesis. There is disc space narrowing at essentially all levels. Port-A-Cath tip is at the cavoatrial junction. There is a left total shoulder replacement. There is soft tissue prominence posterior to the mid thoracic region, possibly hematoma given history of fall. IMPRESSION: Scoliosis with multilevel arthropathy and osteoporosis. Probable chronic areas of endplate concavity and anterior wedging as noted above. It is difficult to in this circumstance to exclude acute bony injury ; if symptoms so warrant, thoracic MR to assess for marrow edema would be the imaging study of choice for further assessment. Soft tissue prominence posterior to the mid thoracic region may represent hematoma in the acute trauma setting. Electronically Signed   By: Lowella Grip III M.D.   On: 10/01/2015 08:21   Ct Chest W Contrast  10/01/2015  CLINICAL DATA:  Fall this morning. It coffee table with back. Low back pain. Personal history breast cancer. Abnormal PET scan suggesting possibility of colon cancer. EXAM: CT CHEST, ABDOMEN, AND PELVIS WITH CONTRAST TECHNIQUE: Multidetector CT imaging of the chest,  abdomen and pelvis was performed following the standard protocol during bolus administration of intravenous contrast. CONTRAST:  58mL OMNIPAQUE IOHEXOL 300 MG/ML  SOLN COMPARISON:  Chest x-ray from the same day.  PET scan 07/21/2015. FINDINGS: CT CHEST FINDINGS Mediastinum/Lymph Nodes: Mitral valve annular calcifications are present. A left subclavian Port-A-Cath is in place. Heart size is normal. No significant pericardial effusion is present. Lungs/Pleura: A moderate-sized right pleural effusion is present. There is some associated atelectasis. Mild peripheral reticular disease is noted in the air right middle lobe as well. There is minimal dependent atelectasis on the left. No other focal nodule, mass, or airspace disease is present. Musculoskeletal: Dedicated reformats of the thoracic spine demonstrate levoconvex scoliosis. Vertebral body heights and alignment are maintained. There is no acute fracture. CT ABDOMEN PELVIS FINDINGS Hepatobiliary: Mild intrahepatic biliary dilation is present. The gallbladder is mildly distended with layering sludge. The common bile duct is unremarkable. No mass lesion is present. Pancreas: Within normal limits Spleen: Normal Adrenals/Urinary Tract: Adrenal glands are within normal limits bilaterally. Subcentimeter cysts are present in both kidneys. The punctate nonobstructing stone is noted at the lower pole of the left kidney. The ureters are within normal limits bilaterally. The urinary bladder is unremarkable. Stomach/Bowel: A  small hiatal hernia is present. Stomach and duodenum are otherwise within normal limits. Small bowel is unremarkable. Moderate stool is present throughout the colon. Vascular/Lymphatic: No significant adenopathy is present. There are small mesenteric lymph nodes. Atherosclerotic calcifications are present in the aorta without aneurysm. Reproductive: Within normal limits Other: Musculoskeletal: Dedicated reformatted images of the a lumbar spine reveal  nondisplaced right transverse process fractures at L1 and L2. Scoliosis is convex to the right at L2. Vertebral body heights and alignment are maintained. Multilevel facet degenerative changes are present. There is a soft tissue contusion within the subcutaneous tissues posterior and to the right of L1 and L2. IMPRESSION: 1. Soft tissue contusion and nondisplaced right transverse fractures at L1 and L2. 2. No other acute fractures or soft tissue trauma. 3. Scoliosis. 4. Moderate right pleural effusion and associated atelectasis. Punctate nonobstructing stone at the lower pole 5. Punctate nonobstructing stone at the lower pole of the left kidney. 6. Small hiatal hernia. Electronically Signed   By: San Morelle M.D.   On: 10/01/2015 12:19   Ct Abdomen Pelvis W Contrast  10/01/2015  CLINICAL DATA:  Fall this morning. It coffee table with back. Low back pain. Personal history breast cancer. Abnormal PET scan suggesting possibility of colon cancer. EXAM: CT CHEST, ABDOMEN, AND PELVIS WITH CONTRAST TECHNIQUE: Multidetector CT imaging of the chest, abdomen and pelvis was performed following the standard protocol during bolus administration of intravenous contrast. CONTRAST:  67mL OMNIPAQUE IOHEXOL 300 MG/ML  SOLN COMPARISON:  Chest x-ray from the same day.  PET scan 07/21/2015. FINDINGS: CT CHEST FINDINGS Mediastinum/Lymph Nodes: Mitral valve annular calcifications are present. A left subclavian Port-A-Cath is in place. Heart size is normal. No significant pericardial effusion is present. Lungs/Pleura: A moderate-sized right pleural effusion is present. There is some associated atelectasis. Mild peripheral reticular disease is noted in the air right middle lobe as well. There is minimal dependent atelectasis on the left. No other focal nodule, mass, or airspace disease is present. Musculoskeletal: Dedicated reformats of the thoracic spine demonstrate levoconvex scoliosis. Vertebral body heights and alignment  are maintained. There is no acute fracture. CT ABDOMEN PELVIS FINDINGS Hepatobiliary: Mild intrahepatic biliary dilation is present. The gallbladder is mildly distended with layering sludge. The common bile duct is unremarkable. No mass lesion is present. Pancreas: Within normal limits Spleen: Normal Adrenals/Urinary Tract: Adrenal glands are within normal limits bilaterally. Subcentimeter cysts are present in both kidneys. The punctate nonobstructing stone is noted at the lower pole of the left kidney. The ureters are within normal limits bilaterally. The urinary bladder is unremarkable. Stomach/Bowel: A small hiatal hernia is present. Stomach and duodenum are otherwise within normal limits. Small bowel is unremarkable. Moderate stool is present throughout the colon. Vascular/Lymphatic: No significant adenopathy is present. There are small mesenteric lymph nodes. Atherosclerotic calcifications are present in the aorta without aneurysm. Reproductive: Within normal limits Other: Musculoskeletal: Dedicated reformatted images of the a lumbar spine reveal nondisplaced right transverse process fractures at L1 and L2. Scoliosis is convex to the right at L2. Vertebral body heights and alignment are maintained. Multilevel facet degenerative changes are present. There is a soft tissue contusion within the subcutaneous tissues posterior and to the right of L1 and L2. IMPRESSION: 1. Soft tissue contusion and nondisplaced right transverse fractures at L1 and L2. 2. No other acute fractures or soft tissue trauma. 3. Scoliosis. 4. Moderate right pleural effusion and associated atelectasis. Punctate nonobstructing stone at the lower pole 5. Punctate nonobstructing stone at the lower  pole of the left kidney. 6. Small hiatal hernia. Electronically Signed   By: San Morelle M.D.   On: 10/01/2015 12:19    Impression/Plan: Healing well from RT.  Good response  The patient feels it is a burden to come to multiple  appointments and prefers to be seen exclusively with Dr. Tressie Stalker. I will see her back on a PRN basis and whenever Dr. Tressie Stalker feels that may be helpful. _____________________________________   Eppie Gibson, MD  This document serves as a record of services personally performed by Eppie Gibson, MD. It was created on her behalf by Darcus Austin, a trained medical scribe. The creation of this record is based on the scribe's personal observations and the provider's statements to them. This document has been checked and approved by the attending provider.

## 2015-10-09 NOTE — Progress Notes (Signed)
[name] here for follow up.  She reports that she fell backwards and hit her back on a glass coffee table last Thursday.  Her lower back has purple bruises on both sides.  She reports having fatigue.  The skin on her right chest wall and left breast has hyperpigmentation.    BP 150/83 mmHg  Pulse 98  Temp(Src) 98.2 F (36.8 C) (Oral)  Ht 5\' 1"  (1.549 m)  Wt 119 lb 8 oz (54.205 kg)  BMI 22.59 kg/m2

## 2015-10-16 ENCOUNTER — Encounter: Payer: Self-pay | Admitting: Nurse Practitioner

## 2015-11-09 ENCOUNTER — Other Ambulatory Visit (HOSPITAL_COMMUNITY): Payer: Self-pay | Admitting: Internal Medicine

## 2015-11-09 DIAGNOSIS — J90 Pleural effusion, not elsewhere classified: Secondary | ICD-10-CM

## 2015-11-11 ENCOUNTER — Ambulatory Visit (HOSPITAL_COMMUNITY)
Admission: RE | Admit: 2015-11-11 | Discharge: 2015-11-11 | Disposition: A | Discharge status: Home / Self Care | Payer: Medicare Other | Source: Ambulatory Visit | Attending: Internal Medicine | Admitting: Internal Medicine

## 2015-11-11 ENCOUNTER — Ambulatory Visit (HOSPITAL_COMMUNITY)
Admission: RE | Admit: 2015-11-11 | Discharge: 2015-11-11 | Disposition: A | Discharge status: Home / Self Care | Payer: Medicare Other | Source: Ambulatory Visit | Attending: Radiology | Admitting: Radiology

## 2015-11-11 DIAGNOSIS — Z9889 Other specified postprocedural states: Secondary | ICD-10-CM

## 2015-11-11 DIAGNOSIS — J90 Pleural effusion, not elsewhere classified: Secondary | ICD-10-CM | POA: Diagnosis not present

## 2015-11-11 DIAGNOSIS — J449 Chronic obstructive pulmonary disease, unspecified: Secondary | ICD-10-CM | POA: Insufficient documentation

## 2015-11-11 LAB — GLUCOSE, SEROUS FLUID: GLUCOSE FL: 134 mg/dL

## 2015-11-11 LAB — GRAM STAIN

## 2015-11-11 LAB — PROTEIN, BODY FLUID: Total protein, fluid: <3 g/dL

## 2015-11-11 NOTE — Procedures (Signed)
US guided diagnostic and therapeutic right thoracentesis performed yielding 500 cc yellow fluid. The fluid was sent to the lab for preordered studies. F/u CXR pending. No immediate complications.

## 2015-11-16 LAB — CULTURE, BODY FLUID-BOTTLE

## 2015-11-16 LAB — CULTURE, BODY FLUID W GRAM STAIN -BOTTLE: Culture: NO GROWTH

## 2015-12-28 ENCOUNTER — Other Ambulatory Visit (HOSPITAL_COMMUNITY): Payer: Self-pay | Admitting: Oncology

## 2015-12-28 DIAGNOSIS — C50919 Malignant neoplasm of unspecified site of unspecified female breast: Secondary | ICD-10-CM

## 2015-12-28 DIAGNOSIS — C50911 Malignant neoplasm of unspecified site of right female breast: Secondary | ICD-10-CM

## 2015-12-28 DIAGNOSIS — K639 Disease of intestine, unspecified: Secondary | ICD-10-CM

## 2016-01-09 ENCOUNTER — Emergency Department (HOSPITAL_COMMUNITY): Payer: Medicare Other

## 2016-01-09 ENCOUNTER — Observation Stay (HOSPITAL_COMMUNITY)
Admission: EM | Admit: 2016-01-09 | Discharge: 2016-01-10 | Disposition: A | Discharge status: Home / Self Care | Payer: Medicare Other | Attending: Internal Medicine | Admitting: Internal Medicine

## 2016-01-09 ENCOUNTER — Other Ambulatory Visit: Payer: Self-pay

## 2016-01-09 DIAGNOSIS — Z7982 Long term (current) use of aspirin: Secondary | ICD-10-CM | POA: Insufficient documentation

## 2016-01-09 DIAGNOSIS — Z79899 Other long term (current) drug therapy: Secondary | ICD-10-CM | POA: Insufficient documentation

## 2016-01-09 DIAGNOSIS — D649 Anemia, unspecified: Secondary | ICD-10-CM | POA: Diagnosis not present

## 2016-01-09 DIAGNOSIS — Z90710 Acquired absence of both cervix and uterus: Secondary | ICD-10-CM | POA: Insufficient documentation

## 2016-01-09 DIAGNOSIS — Z923 Personal history of irradiation: Secondary | ICD-10-CM | POA: Diagnosis not present

## 2016-01-09 DIAGNOSIS — J90 Pleural effusion, not elsewhere classified: Secondary | ICD-10-CM | POA: Insufficient documentation

## 2016-01-09 DIAGNOSIS — R0602 Shortness of breath: Secondary | ICD-10-CM | POA: Diagnosis not present

## 2016-01-09 DIAGNOSIS — R079 Chest pain, unspecified: Principal | ICD-10-CM | POA: Diagnosis present

## 2016-01-09 DIAGNOSIS — R740 Nonspecific elevation of levels of transaminase and lactic acid dehydrogenase [LDH]: Secondary | ICD-10-CM | POA: Insufficient documentation

## 2016-01-09 DIAGNOSIS — C50911 Malignant neoplasm of unspecified site of right female breast: Secondary | ICD-10-CM | POA: Diagnosis present

## 2016-01-09 DIAGNOSIS — Z90722 Acquired absence of ovaries, bilateral: Secondary | ICD-10-CM | POA: Diagnosis not present

## 2016-01-09 DIAGNOSIS — Z9221 Personal history of antineoplastic chemotherapy: Secondary | ICD-10-CM | POA: Diagnosis not present

## 2016-01-09 DIAGNOSIS — Z9079 Acquired absence of other genital organ(s): Secondary | ICD-10-CM | POA: Diagnosis not present

## 2016-01-09 DIAGNOSIS — Z9011 Acquired absence of right breast and nipple: Secondary | ICD-10-CM | POA: Diagnosis not present

## 2016-01-09 DIAGNOSIS — Z9049 Acquired absence of other specified parts of digestive tract: Secondary | ICD-10-CM | POA: Insufficient documentation

## 2016-01-09 DIAGNOSIS — R7401 Elevation of levels of liver transaminase levels: Secondary | ICD-10-CM | POA: Diagnosis present

## 2016-01-09 LAB — HEPATIC FUNCTION PANEL
ALK PHOS: 227 U/L — AB (ref 38–126)
ALT: 105 U/L — ABNORMAL HIGH (ref 14–54)
AST: 256 U/L — ABNORMAL HIGH (ref 15–41)
Albumin: 2.8 g/dL — ABNORMAL LOW (ref 3.5–5.0)
BILIRUBIN INDIRECT: 0.2 mg/dL — AB (ref 0.3–0.9)
BILIRUBIN TOTAL: 0.8 mg/dL (ref 0.3–1.2)
Bilirubin, Direct: 0.6 mg/dL — ABNORMAL HIGH (ref 0.1–0.5)
TOTAL PROTEIN: 5.4 g/dL — AB (ref 6.5–8.1)

## 2016-01-09 LAB — BASIC METABOLIC PANEL
ANION GAP: 9 (ref 5–15)
BUN: 11 mg/dL (ref 6–20)
CALCIUM: 8.4 mg/dL — AB (ref 8.9–10.3)
CO2: 24 mmol/L (ref 22–32)
CREATININE: 0.58 mg/dL (ref 0.44–1.00)
Chloride: 106 mmol/L (ref 101–111)
Glucose, Bld: 131 mg/dL — ABNORMAL HIGH (ref 65–99)
Potassium: 4.1 mmol/L (ref 3.5–5.1)
SODIUM: 139 mmol/L (ref 135–145)

## 2016-01-09 LAB — CBC
HCT: 30.5 % — ABNORMAL LOW (ref 36.0–46.0)
HEMOGLOBIN: 9.4 g/dL — AB (ref 12.0–15.0)
MCH: 24 pg — AB (ref 26.0–34.0)
MCHC: 30.8 g/dL (ref 30.0–36.0)
MCV: 78 fL (ref 78.0–100.0)
Platelets: 280 10*3/uL (ref 150–400)
RBC: 3.91 MIL/uL (ref 3.87–5.11)
RDW: 22.7 % — ABNORMAL HIGH (ref 11.5–15.5)
WBC: 8 10*3/uL (ref 4.0–10.5)

## 2016-01-09 LAB — D-DIMER, QUANTITATIVE (NOT AT ARMC): D DIMER QUANT: 0.4 ug{FEU}/mL (ref 0.00–0.50)

## 2016-01-09 LAB — TROPONIN I: Troponin I: <0.03 ng/mL (ref <0.031)

## 2016-01-09 LAB — I-STAT TROPONIN, ED: Troponin i, poc: 0 ng/mL (ref 0.00–0.08)

## 2016-01-09 MED ORDER — ZOLPIDEM TARTRATE 5 MG PO TABS
5.0000 mg | ORAL_TABLET | Freq: Every day | ORAL | Status: DC
  Administered 2016-01-09: 5 mg via ORAL
  Filled 2016-01-09: qty 1

## 2016-01-09 MED ORDER — VITAMIN D3 25 MCG (1000 UNIT) PO TABS
5000.0000 [IU] | ORAL_TABLET | Freq: Every day | ORAL | Status: DC
  Administered 2016-01-10: 5000 [IU] via ORAL
  Filled 2016-01-09 (×2): qty 5

## 2016-01-09 MED ORDER — ONDANSETRON HCL 4 MG/2ML IJ SOLN: 4.0000 mg | Freq: Four times a day (QID) | INTRAMUSCULAR | Status: DC | PRN

## 2016-01-09 MED ORDER — MORPHINE SULFATE (PF) 2 MG/ML IV SOLN: 2.0000 mg | INTRAVENOUS | Status: DC | PRN

## 2016-01-09 MED ORDER — GI COCKTAIL ~~LOC~~: 30.0000 mL | Freq: Four times a day (QID) | ORAL | Status: DC | PRN

## 2016-01-09 MED ORDER — ACETAMINOPHEN 325 MG PO TABS: 650.0000 mg | ORAL_TABLET | ORAL | Status: DC | PRN

## 2016-01-09 MED ORDER — HYDRALAZINE HCL 20 MG/ML IJ SOLN: 2.0000 mg | Freq: Once | INTRAMUSCULAR | Status: DC

## 2016-01-09 MED ORDER — ASPIRIN 81 MG PO CHEW
81.0000 mg | CHEWABLE_TABLET | Freq: Every day | ORAL | Status: DC
  Administered 2016-01-10: 81 mg via ORAL
  Filled 2016-01-09 (×2): qty 1

## 2016-01-09 MED ORDER — ENOXAPARIN SODIUM 40 MG/0.4ML ~~LOC~~ SOLN
40.0000 mg | SUBCUTANEOUS | Status: DC
  Administered 2016-01-09: 40 mg via SUBCUTANEOUS
  Filled 2016-01-09: qty 0.4

## 2016-01-09 MED ORDER — SODIUM CHLORIDE 0.9% FLUSH
10.0000 mL | INTRAVENOUS | Status: DC | PRN
  Administered 2016-01-10: 10 mL
  Filled 2016-01-09: qty 40

## 2016-01-09 NOTE — H&P (Signed)
[facility] History and Physical                                                                                    [name], is a 82 y.o. female  MRN: [id number]   DOB - March 11, 1920  Admit Date - 01/09/2016  Outpatient Primary MD for the patient is Minda Meo, MD  Referring MD: Dalene Seltzer / ER  PMH: Past Medical History  Diagnosis Date  . History of shingles 02/16/12  . History of hip fracture   . Hx of Clostridium difficile infection     May/June 2015  . Difficult intubation     difficulty with 1st 2 breast cancers in the late 1980's in [place]  . Pneumonia     as a child  . History of recurrent UTIs   . Anemia   . Hx of transfusion of packed red blood cells   . Arthritis   . H/O fracture of hip     left  . Anemia     while in hospital with broken hip  . Stroke [facility])     ? TIA   . Tuberculosis     as a child  . Constipation   . Complication of anesthesia     had one time where they could not get her under anesthesia  . Shingles   . History of chemotherapy 11/04/14- 01/13/15    Dr Mariel Sleet, [facility]  . uterine ca dx'd 1998     [facility] CA of uterus I  . Cancer (HCC)     Breast cancer X 2  . Breast cancer (HCC) 10/15/14    right, triple neg  . Breast CA (HCC) dx'd 12/2014    left      PSH: Past Surgical History  Procedure Laterality Date  . Appendectomy    . Abdominal hysterectomy      TAH BSO  . Shoulder surgery Left     has titanium ball in shoulder   . Cataract extraction    . Hemorrhoid surgery    . Breast surgery      Lumpectomy right and left breast-Radiation and Tamoxfen  . Oophorectomy      BSO  . Eye surgery Bilateral     cataract surgery w/ lens implant  . Fracture surgery Left     hip  . Portacath placement Left 10/31/2014    Procedure: INSERTION PORT-A-CATH WITH ULTRA SOUND ;  Surgeon: Claud Kelp, MD;  Location: [facility] OR;  Service: General;  Laterality: Left;  . Lipoma excision Right 10/31/2014    Procedure: EXCISION  2 SKIN LESIONS RIGHT CHEST WALL;  Surgeon: Claud Kelp, MD;  Location: [facility] OR;  Service: General;  Laterality: Right;  . Fracture surgery      left hip  . Colonoscopy    . Hemorroidectomy      x 2  . Eye surgery Bilateral     cataract surgery  . Tonsillectomy    . Abdominal hysterectomy    . Breast surgery Bilateral     lumpectomy  . Shoulder surgery Left   . Port a cath insertion    . Mastectomy Right 01/26/2015  DR Derrell Lolling  . Total mastectomy Right 01/26/2015    Procedure: RIGHT TOTAL MASTECTOMY;  Surgeon: Claud Kelp, MD;  Location: [facility] OR;  Service: General;  Laterality: Right;     CC:  Chief Complaint  Patient presents with  . Chest Pain     HPI:  This is a very pleasant 82 year old female patient who has a significant past medical history of right breast cancer diagnosed in 2016. She underwent a right total mastectomy at that time after presurgical Taxol and carboplatin therapies. Subsequently she underwent radiation to the breast. She is had several recurrences of cutaneous nodules that are cancerous in nature the first time on the right breast, the second time much smaller lesions encircling the left breast. Each time she was treated with chemotherapy and radiation. In December she also went ultrasound-guided thoracentesis of a right-sided pleural effusion with pathology favoring reactive mesothelial cells.. Unfortunately she has had an additional recurrence of a solitary dime size lesion on her left breast and according to the patient's report she is no longer wanted to be pursuing any additional treatments. Patient reports that last PET scan done in August was concerning for possible cancer near her liver. The PET scan itself was read as intense metabolic activity at the hepatic flexure with circumferential bowel wall thickening most consistent with adenocarcinoma of the colon.  She presents today because she helped sudden severe substernal nonradiating chest pain/pressure  that was associated with shortness of breath. The chest pain/pressure only resolved after treatment with aspirin and nitroglycerin. The pain lasted 2 hours and even after the discomfort/pain resolved she did have residual shortness of breath which has now resolved. She did not have any nausea or diaphoresis with this pain.  ER Evaluation and treatment: Afebrile, BP 122/56, pulse 90 and regular, respirations 22 and room air saturations 98%. EKG: Sinus rhythm with sinus arrhythmia versus PACs, ventricular rate 90 bpm, QTC 464 ms, elevated J-point but no ischemic changes. 2 View CXR: Chronic small to moderate right pleural effusion with mild right upper and lower lobe atelectasis crease from chest x-ray in December without evidence of pneumonia pulmonary edema Laboratory data: Na 139, K 4.1, BUN 11, Cr 0.58, calcium 8.4, glucose 131, alkaline phosphatase 227, albumin 2.8, AST 256, ALT 14 with normal total bilirubin, Initial troponin 0.00, WBC 8000, hemoglobin 9.4, platelets 280,000, d-dimer negative at 0.4  Review of Systems   In addition to the HPI above,  No Fever-chills, myalgias or other constitutional symptoms No Headache, changes with Vision or hearing, new weakness, tingling, numbness in any extremity, No problems swallowing food or Liquids, indigestion/reflux No Cough or palpitations, orthopnea or DOE No Abdominal pain, N/V; no melena or hematochezia, no dark tarry stools No dysuria, hematuria or flank pain No new skin rashes, lesions, masses or bruises, No new joints pains-aches No recent weight gain or loss No polyuria, polydypsia or polyphagia,  *A full 10 point Review of Systems was done, except as stated above, all other Review of Systems were negative.  Social History Social History  Substance Use Topics  . Smoking status: Never Smoker   . Smokeless tobacco: Never Used  . Alcohol Use: 4.2 oz/week    7 Glasses of wine, 7 Standard drinks or equivalent per week    Resides at:  Private residence  Lives with: Alone  Ambulatory status: Without assistive devices   Family History Family History  Problem Relation Age of Onset  . Hypertension Mother   . Heart disease Mother   .  CVA Mother   . Hypertension Father   . Heart disease Father   . Lung disease Father   . Cancer Father     stomach  . Breast cancer Sister     age 19  . Hypertension Sister   . Diabetes Brother   . Hypertension Brother   . Heart disease Brother      Prior to Admission medications   Medication Sig Start Date End Date Taking? Authorizing Provider  aspirin 81 MG tablet Take 81 mg by mouth daily.   Yes Historical Provider, MD  Cholecalciferol (VITAMIN D3) 5000 UNITS CAPS Take 1 capsule by mouth daily.   Yes Historical Provider, MD  Cranberry 475 MG CAPS Take 1 capsule by mouth daily.   Yes Historical Provider, MD  Multiple Vitamins-Minerals (CENTRUM SILVER ULTRA WOMENS PO) Take 1 tablet by mouth daily.   Yes Historical Provider, MD  Probiotic Product (PROBIOTIC DAILY PO) Take 1 capsule by mouth daily.   Yes Historical Provider, MD  zolpidem (AMBIEN) 5 MG tablet Take 1 tablet by mouth at bedtime.  01/27/15  Yes Historical Provider, MD  oxyCODONE (OXY IR/ROXICODONE) 5 MG immediate release tablet Take 1-2 tablets q 4hrs PRN pain. Patient not taking: Reported on 10/01/2015 04/27/15   Lonie Peak, MD  senna-docusate (SENOKOT-S) 8.6-50 MG per tablet Take 1 tablet by mouth daily. Take on days that you are taking oxycodone. Stop if you have diarrhea. Patient not taking: Reported on 10/01/2015 04/27/15   Lonie Peak, MD    Allergies  Allergen Reactions  . Other Other (See Comments)    One of the "mycins" cannot remember which one or the reaction  . Penicillins Itching and Swelling    Has patient had a PCN reaction causing immediate rash, facial/tongue/throat swelling, SOB or lightheadedness with hypotension: Unknown Has patient had a PCN reaction causing severe rash involving mucus membranes  or skin necrosis: No  Has patient had a PCN reaction that required hospitalization: Unknown Has patient had a PCN reaction occurring within the last 10 years: No  If all of the above answers are "NO", then may proceed with Cephalosporin use.   . Sulfa Antibiotics Other (See Comments)    unknown  . Tape Other (See Comments)    Pt gets red and breaks out    Physical Exam  Vitals  Blood pressure 150/64, pulse 85, temperature 98.6 F (37 C), temperature source Oral, resp. rate 27, SpO2 97 %.   General:  In no acute distress, appears younger than stated age  Psych:  Normal affect, Denies Suicidal or Homicidal ideations, Awake Alert, Oriented X 3. Speech and thought patterns are clear and appropriate, no apparent short term memory deficits  Neuro:   No focal neurological deficits, CN II through XII intact, Strength 5/5 all 4 extremities, Sensation intact all 4 extremities.  ENT:  Ears and Eyes appear Normal, Conjunctivae clear, PER. Moist oral mucosa without erythema or exudates.  Neck:  Supple, No lymphadenopathy appreciated  Respiratory:  Symmetrical chest wall movement, Good air movement bilaterally, CTAB. Room Air  Breast exam: Well-healed scar right anterior chest wall, well-circumscribed raised purplish/reddish dime-sized lesion on inferior aspect of left breast  Cardiac:  RRR, No Murmurs, no LE edema noted, no JVD, No carotid bruits, peripheral pulses palpable at 2+  Abdomen:  Positive bowel sounds, Soft, somewhat tender palpation in right upper quadrant, Non distended,  No masses appreciated  Skin:  No Cyanosis, Normal Skin Turgor, No Skin Rash or Bruise.  Extremities: Symmetrical  without obvious trauma or injury,  no effusions.  Data Review  CBC  Recent Labs Lab 01/09/16 1440  WBC 8.0  HGB 9.4*  HCT 30.5*  PLT 280  MCV 78.0  MCH 24.0*  MCHC 30.8  RDW 22.7*    Chemistries   Recent Labs Lab 01/09/16 1440  NA 139  K 4.1  CL 106  CO2 24  GLUCOSE 131*    BUN 11  CREATININE 0.58  CALCIUM 8.4*  AST 256*  ALT 105*  ALKPHOS 227*  BILITOT 0.8    CrCl cannot be calculated (Unknown ideal weight.).  No results for input(s): TSH, T4TOTAL, T3FREE, THYROIDAB in the last 72 hours.  Invalid input(s): FREET3  Coagulation profile No results for input(s): INR, PROTIME in the last 168 hours.   Recent Labs  01/09/16 1440  DDIMER 0.40    Cardiac Enzymes No results for input(s): CKMB, TROPONINI, MYOGLOBIN in the last 168 hours.  Invalid input(s): CK  Invalid input(s): POCBNP  Urinalysis    Component Value Date/Time   COLORURINE YELLOW 07/31/2010 1040   APPEARANCEUR CLOUDY* 07/31/2010 1040   LABSPEC 1.006 07/31/2010 1040   PHURINE 7.0 07/31/2010 1040   GLUCOSEU NEGATIVE 07/31/2010 1040   HGBUR NEGATIVE 07/31/2010 1040   BILIRUBINUR NEGATIVE 07/31/2010 1040   KETONESUR NEGATIVE 07/31/2010 1040   PROTEINUR NEGATIVE 07/31/2010 1040   UROBILINOGEN 0.2 07/31/2010 1040   NITRITE NEGATIVE 07/31/2010 1040   LEUKOCYTESUR  07/31/2010 1040    NEGATIVE MICROSCOPIC NOT DONE ON URINES WITH NEGATIVE PROTEIN, BLOOD, LEUKOCYTES, NITRITE, OR GLUCOSE <1000 mg/dL.    Imaging results:   Dg Chest 2 View  01/09/2016  CLINICAL DATA:  Pt reports mid-sternal chest pain/pressure onset today; pt reports h/o bilateral breast cancer; non-smoker EXAM: CHEST  2 VIEW COMPARISON:  11/11/2015 FINDINGS: There is opacity at the right lung base collecting a small to moderate pleural effusion similar to the prior exam. There is associated right lung base atelectasis. Some hazy opacity is also projects adjacent to the minor fissure in the right upper lobe consistent with atelectasis. There is no convincing pulmonary edema or pneumonia. No left pleural effusion. No pneumothorax. Cardiac silhouette is top-normal in size. No mediastinal or hilar masses or convincing adenopathy. Left anterior chest wall Port-A-Cath is stable with its tip at the caval atrial junction. There  changes from bilateral breast surgery. Left shoulder prosthesis is well aligned. Bony thorax is demineralized. IMPRESSION: 1. Chronic small moderate right pleural effusion. There is also mild right upper and lower lobe atelectasis, which appears increased from the prior chest radiograph. 2. No evidence of pneumonia or pulmonary edema. Electronically Signed   By: Amie Portland M.D.   On: 01/09/2016 14:13     EKG: (Independently reviewed)  Sinus rhythm with sinus arrhythmia versus PACs, ventricular rate 90 bpm, QTC 464 ms, elevated J-point but no ischemic changes.   Assessment & Plan  Principal Problem:   Chest pain -Has typical features and was relieved with aspirin and nitroglycerin -Admitted to telemetry/Obs -Cycle troponin and check echocardiogram -Given recurrence of breast cancer that seems to be metastatic likely not a candidate for invasive evaluation such as cardiac catheterization and focus would be on medical management if patient truly felt to have ischemic etiology to her chest pain   Active Problems:   Local recurrence of cancer of right breast  -Patient has undergone multiple rounds of chemotherapy as well as radiation and now has developed a new lesion on the left breast; per her report she  has not been undergo further treatments    Transaminitis -Suspect this is related to metastatic disease given elevated alkaline phosphatase and associated elevated ALT/AST without elevation in total bilirubin -Patient was also found to have suspicious area on PET scan at the hepatic flexure -Offered option of obtaining CT scan chest abdomen and pelvis to patient during admission but since she already has a PET scan scheduled for this Monday with her oncologist she wishes to proceed with this study instead    Anemia -Hemoglobin slightly lower than baseline of around 10-11 and is currently 9.4    DVT Prophylaxis: Lovenox  Family Communication:   No family at bedside  Code Status:  Full  code  Condition:  Stable  Discharge disposition: Anticipation discharged back to previous home environment within the next 24-48 hours  Time spent in minutes : 60      ELLIS,ALLISON L. ANP on 01/09/2016 at 5:33 PM  You may contact me by going to [email or web address] - password [email or web address]  I am available from 7a-7p but please confirm I am on the schedule by going to Amion as above.   After 7p please contact night coverage person covering me after hours  [facility]  I have examined the patient, reviewed the chart and modified the above note which I agree with. H/o Metastatic breast cancer s/p radiation and chemotherapy. Complains of severe, heavy pain across her upper chest at rest.  Follow troponin and 2 D ECHO. Will hold off on ordering a stress test for now.    Malvika Tung,MD Pager # [email or web address] 01/09/2016, 5:54 PM

## 2016-01-09 NOTE — Progress Notes (Signed)
   01/09/16 1945  Vitals  Temp 98.2 F (36.8 C)  Temp Source Oral  BP (!) 166/65 mmHg  BP Location Left Arm  BP Method Automatic  Patient Position (if appropriate) Lying  Pulse Rate 85  Pulse Rate Source Dinamap  Resp (!) 21  Oxygen Therapy  SpO2 98 %  O2 Device Room Air  Height and Weight  Height 4\' 11"  (1.499 m)  Weight 51.4 kg (113 lb 5.1 oz)  Type of Scale Used Bed  BSA (Calculated - sq m) 1.46 sq meters  BMI (Calculated) 22.9  Weight in (lb) to have BMI = 25 123.5   Patient arrived from the ED. Pt alert and oriented X4. Denies chest pain/pressure. Pt. Oriented to room, call bell and call light. CCMd called and verified. MD notified about the BP. New order for hydralazine 2mg  given. Will continue to monitor closely.

## 2016-01-09 NOTE — ED Provider Notes (Signed)
CSN: [id number]     Arrival date & time 01/09/16  1254 History   First MD Initiated Contact with Patient 01/09/16 1303     Chief Complaint  Patient presents with  . Chest Pain     (Consider location/radiation/quality/duration/timing/severity/associated sxs/prior Treatment) Patient is a 82 y.o. female presenting with chest pain.  Chest Pain Pain location:  Substernal area Pain quality: pressure and tightness   Pain radiates to:  Does not radiate Pain radiates to the back: no   Pain severity:  Severe Onset quality:  Sudden Duration:  2 hours Timing:  Constant Progression:  Improving Chronicity:  New Context comment:  After eating Relieved by:  Nitroglycerin Worsened by:  Nothing tried Ineffective treatments:  Leaning forward, certain positions and rest Associated symptoms: fatigue, nausea and shortness of breath   Associated symptoms: no abdominal pain, no back pain, no cough, no diaphoresis, no fever, no headache, no numbness, not vomiting and no weakness   Risk factors: no coronary artery disease, no diabetes mellitus, no high cholesterol, no hypertension and no prior DVT/PE   Risk factors comment:  Hx of breast cancer   Past Medical History  Diagnosis Date  . History of shingles 02/16/12  . History of hip fracture   . Hx of Clostridium difficile infection     May/June 2015  . Difficult intubation     difficulty with 1st 2 breast cancers in the late 1980's in [place]  . Pneumonia     as a child  . History of recurrent UTIs   . Anemia   . Hx of transfusion of packed red blood cells   . Arthritis   . H/O fracture of hip     left  . Anemia     while in hospital with broken hip  . Stroke [facility])     ? TIA   . Tuberculosis     as a child  . Constipation   . Complication of anesthesia     had one time where they could not get her under anesthesia  . Shingles   . History of chemotherapy 11/04/14- 01/13/15    Dr Tressie Stalker, [place]  . uterine ca dx'd 1998     [facility]  CA of uterus I  . Cancer ([place])     Breast cancer X 2  . Breast cancer ([place]) 10/15/14    right, triple neg  . Breast CA ([place]) dx'd 12/2014    left   Past Surgical History  Procedure Laterality Date  . Appendectomy    . Abdominal hysterectomy      TAH BSO  . Shoulder surgery Left     has titanium ball in shoulder   . Cataract extraction    . Hemorrhoid surgery    . Breast surgery      Lumpectomy right and left breast-Radiation and Tamoxfen  . Oophorectomy      BSO  . Eye surgery Bilateral     cataract surgery w/ lens implant  . Fracture surgery Left     hip  . Portacath placement Left 10/31/2014    Procedure: INSERTION PORT-A-CATH WITH ULTRA SOUND ;  Surgeon: Fanny Skates, MD;  Location: [place];  Service: General;  Laterality: Left;  . Lipoma excision Right 10/31/2014    Procedure: EXCISION 2 SKIN LESIONS RIGHT CHEST WALL;  Surgeon: Fanny Skates, MD;  Location: [place];  Service: General;  Laterality: Right;  . Fracture surgery      left hip  . Colonoscopy    .  Hemorroidectomy      x 2  . Eye surgery Bilateral     cataract surgery  . Tonsillectomy    . Abdominal hysterectomy    . Breast surgery Bilateral     lumpectomy  . Shoulder surgery Left   . Port a cath insertion    . Mastectomy Right 01/26/2015    DR Dalbert Batman  . Total mastectomy Right 01/26/2015    Procedure: RIGHT TOTAL MASTECTOMY;  Surgeon: Fanny Skates, MD;  Location: [place];  Service: General;  Laterality: Right;   Family History  Problem Relation Age of Onset  . Hypertension Mother   . Heart disease Mother   . CVA Mother   . Hypertension Father   . Heart disease Father   . Lung disease Father   . Cancer Father     stomach  . Breast cancer Sister     age 93  . Hypertension Sister   . Diabetes Brother   . Hypertension Brother   . Heart disease Brother    Social History  Substance Use Topics  . Smoking status: Never Smoker   . Smokeless tobacco: Never Used  . Alcohol Use: 4.2 oz/week    7  Glasses of wine, 7 Standard drinks or equivalent per week   OB History    Gravida Para Term Preterm AB TAB SAB Ectopic Multiple Living   3 3 3  0 0 0 0 0       Review of Systems  Constitutional: Positive for fatigue. Negative for fever and diaphoresis.  HENT: Negative for sore throat.   Eyes: Negative for visual disturbance.  Respiratory: Positive for shortness of breath. Negative for cough.   Cardiovascular: Positive for chest pain.  Gastrointestinal: Positive for nausea. Negative for vomiting and abdominal pain.  Genitourinary: Negative for difficulty urinating.  Musculoskeletal: Negative for back pain and neck pain.  Skin: Negative for rash.  Neurological: Negative for syncope, weakness, numbness and headaches.      Allergies  Other; Penicillins; Sulfa antibiotics; and Tape  Home Medications   Prior to Admission medications   Medication Sig Start Date End Date Taking? Authorizing Provider  aspirin 81 MG tablet Take 81 mg by mouth daily.   Yes Historical Provider, MD  Cholecalciferol (VITAMIN D3) 5000 UNITS CAPS Take 1 capsule by mouth daily.   Yes Historical Provider, MD  Cranberry 475 MG CAPS Take 1 capsule by mouth daily.   Yes Historical Provider, MD  Multiple Vitamins-Minerals (CENTRUM SILVER ULTRA WOMENS PO) Take 1 tablet by mouth daily.   Yes Historical Provider, MD  Probiotic Product (PROBIOTIC DAILY PO) Take 1 capsule by mouth daily.   Yes Historical Provider, MD  zolpidem (AMBIEN) 5 MG tablet Take 1 tablet by mouth at bedtime.  01/27/15  Yes Historical Provider, MD  oxyCODONE (OXY IR/ROXICODONE) 5 MG immediate release tablet Take 1-2 tablets q 4hrs PRN pain. Patient not taking: Reported on 10/01/2015 04/27/15   Eppie Gibson, MD  senna-docusate (SENOKOT-S) 8.6-50 MG per tablet Take 1 tablet by mouth daily. Take on days that you are taking oxycodone. Stop if you have diarrhea. Patient not taking: Reported on 10/01/2015 04/27/15   Eppie Gibson, MD   BP 126/84 mmHg  Pulse 89   Temp(Src) 98.6 F (37 C) (Oral)  Resp 23  SpO2 99% Physical Exam  Constitutional: She is oriented to person, place, and time. She appears well-developed and well-nourished. No distress.  HENT:  Head: Normocephalic and atraumatic.  Eyes: Conjunctivae and EOM are normal.  Neck: Normal range of motion.  Cardiovascular: Normal rate, regular rhythm, normal heart sounds and intact distal pulses.  Exam reveals no gallop and no friction rub.   No murmur heard. Pulmonary/Chest: Effort normal and breath sounds normal. No respiratory distress. She has no wheezes. She has no rales.  Abdominal: Soft. She exhibits no distension. There is no tenderness. There is no guarding.  Musculoskeletal: She exhibits no edema or tenderness.  Neurological: She is alert and oriented to person, place, and time.  Skin: Skin is warm and dry. No rash noted. She is not diaphoretic. No erythema.  Nursing note and vitals reviewed.   ED Course  Procedures (including critical care time) Labs Review Labs Reviewed  BASIC METABOLIC PANEL - Abnormal; Notable for the following:    Glucose, Bld 131 (*)    Calcium 8.4 (*)    All other components within normal limits  CBC - Abnormal; Notable for the following:    Hemoglobin 9.4 (*)    HCT 30.5 (*)    MCH 24.0 (*)    RDW 22.7 (*)    All other components within normal limits  HEPATIC FUNCTION PANEL - Abnormal; Notable for the following:    Total Protein 5.4 (*)    Albumin 2.8 (*)    AST 256 (*)    ALT 105 (*)    Alkaline Phosphatase 227 (*)    Bilirubin, Direct 0.6 (*)    Indirect Bilirubin 0.2 (*)    All other components within normal limits  D-DIMER, QUANTITATIVE (NOT AT [facility])  Randolm Idol, ED    Imaging Review Dg Chest 2 View  01/09/2016  CLINICAL DATA:  Pt reports mid-sternal chest pain/pressure onset today; pt reports h/o bilateral breast cancer; non-smoker EXAM: CHEST  2 VIEW COMPARISON:  11/11/2015 FINDINGS: There is opacity at the right lung base  collecting a small to moderate pleural effusion similar to the prior exam. There is associated right lung base atelectasis. Some hazy opacity is also projects adjacent to the minor fissure in the right upper lobe consistent with atelectasis. There is no convincing pulmonary edema or pneumonia. No left pleural effusion. No pneumothorax. Cardiac silhouette is top-normal in size. No mediastinal or hilar masses or convincing adenopathy. Left anterior chest wall Port-A-Cath is stable with its tip at the caval atrial junction. There changes from bilateral breast surgery. Left shoulder prosthesis is well aligned. Bony thorax is demineralized. IMPRESSION: 1. Chronic small moderate right pleural effusion. There is also mild right upper and lower lobe atelectasis, which appears increased from the prior chest radiograph. 2. No evidence of pneumonia or pulmonary edema. Electronically Signed   By: Lajean Manes M.D.   On: 01/09/2016 14:13   I have personally reviewed and evaluated these images and lab results as part of my medical decision-making.   EKG Interpretation   Date/Time:  Saturday January 09 2016 12:48:42 EST Ventricular Rate:  90 PR Interval:  156 QRS Duration: 72 QT Interval:  380 QTC Calculation: 464 R Axis:   50 Text Interpretation:  Sinus rhythm with marked sinus arrhythmia Otherwise  normal ECG No significant change since last tracing Confirmed by  [facility] MD, Kelden Lavallee ([id number]) on 01/09/2016 1:04:27 PM      MDM   Final diagnoses:  Chest pain, unspecified chest pain type  Shortness of breath   82 year old female with history of breast cancer, right pleural effusion, stroke, presents with concern of chest pain. Differential diagnosis for chest pain includes pulmonary embolus, dissection, pneumothorax, pneumonia, ACS, myocarditis,  pericarditis.  EKG was done and evaluate by me and showed no acute ST changes and no signs of pericarditis. Chest x-ray was done and evaluated by me and radiology  and showed no sign of pneumonia or pneumothorax. Low risk [place] and ddimer negative.  Chest pain relieved by nitroglycerin with EMS, and patient received aspirin with EMS. Initial troponin is negative. Transaminitis on labs, pt without abdominal pain, doubt acute intraabdominal process, transaminitis may be secondary to cancer, and per daughter she had some type of liver abnormality however no sign of this in records.  Etiology of patient's CP may be secondary to malignancy, versus possible anginal symptoms.     Gareth Morgan, MD 01/09/16 [phone]

## 2016-01-09 NOTE — ED Notes (Signed)
Per EMS- pt reports a new onset of substernal chest pain/pressure. Pt received 324mg  of asprin and 1 nitro with 128ml NS. BP 97/54

## 2016-01-10 ENCOUNTER — Observation Stay (HOSPITAL_BASED_OUTPATIENT_CLINIC_OR_DEPARTMENT_OTHER): Payer: Medicare Other

## 2016-01-10 DIAGNOSIS — C50911 Malignant neoplasm of unspecified site of right female breast: Secondary | ICD-10-CM | POA: Diagnosis not present

## 2016-01-10 DIAGNOSIS — R079 Chest pain, unspecified: Secondary | ICD-10-CM

## 2016-01-10 LAB — TROPONIN I: Troponin I: <0.03 ng/mL (ref <0.031)

## 2016-01-10 MED ORDER — HEPARIN SOD (PORK) LOCK FLUSH 100 UNIT/ML IV SOLN
500.0000 [IU] | INTRAVENOUS | Status: AC | PRN
Start: 2016-01-10 — End: 2016-01-10
  Administered 2016-01-10: 500 [IU]

## 2016-01-10 MED ORDER — NITROGLYCERIN 0.4 MG SL SUBL: 0.4000 mg | SUBLINGUAL_TABLET | SUBLINGUAL | Status: AC | PRN

## 2016-01-10 MED ORDER — NITROGLYCERIN 0.4 MG SL SUBL: 0.4000 mg | SUBLINGUAL_TABLET | SUBLINGUAL | Status: DC | PRN

## 2016-01-10 NOTE — Progress Notes (Signed)
Echocardiogram 2D Echocardiogram has been performed.  [name] 01/10/2016, 11:07 AM

## 2016-01-10 NOTE — Discharge Summary (Signed)
Physician Discharge Summary  [name] [id number] DOB: 06/29/1920 DOA: 01/09/2016  PCP: Geoffery Lyons, MD  Admit date: 01/09/2016 Discharge date: 01/10/2016  Time spent: 35 minutes  Recommendations for Outpatient Follow-up:  1. Follow with PCP , reassess need for BP medications.  2. Follow up with primary oncologist for PET scan    Discharge Diagnoses:    Chest pain   Local recurrence of cancer of right breast ([place])   Anemia   Transaminitis   Chest pain syndrome   Discharge Condition: Stable  Diet recommendation: Heart healthy  Filed Weights   01/09/16 1945  Weight: 51.4 kg (113 lb 5.1 oz)    History of present illness:  This is a very pleasant 82 year old female patient who has a significant past medical history of right breast cancer diagnosed in 2016. She underwent a right total mastectomy at that time after presurgical Taxol and carboplatin therapies. Subsequently she underwent radiation to the breast. She is had several recurrences of cutaneous nodules that are cancerous in nature the first time on the right breast, the second time much smaller lesions encircling the left breast. Each time she was treated with chemotherapy and radiation. In December she also went ultrasound-guided thoracentesis of a right-sided pleural effusion with pathology favoring reactive mesothelial cells.. Unfortunately she has had an additional recurrence of a solitary dime size lesion on her left breast and according to the patient's report she is no longer wanted to be pursuing any additional treatments. Patient reports that last PET scan done in August was concerning for possible cancer near her liver. The PET scan itself was read as intense metabolic activity at the hepatic flexure with circumferential bowel wall thickening most consistent with adenocarcinoma of the colon.  She presents today because she helped sudden severe substernal nonradiating chest pain/pressure that was associated  with shortness of breath. The chest pain/pressure only resolved after treatment with aspirin and nitroglycerin. The pain lasted 2 hours and even after the discomfort/pain resolved she did have residual shortness of breath which has now resolved. She did not have any nausea or diaphoresis with this pain.  Hospital Course:  Chest pain -Has typical features and was relieved with aspirin and nitroglycerin -troponin negative, ECHO normal EF -D dimer negative.  -will give prescription for nitroglycerin prn.  -BP elevated today, patient is very anxious to be discharge. Yesterday BP was fine. Needs to follow up with PCP and reassess need for medications for BP.     Local recurrence of cancer of right breast  -Patient has undergone multiple rounds of chemotherapy as well as radiation and now has developed a new lesion on the left breast; per her report she has not been undergo further treatments   Transaminitis -Suspect this is related to metastatic disease given elevated alkaline phosphatase and associated elevated ALT/AST without elevation in total bilirubin -Patient was also found to have suspicious area on PET scan at the hepatic flexure -Offered option of obtaining CT scan chest abdomen and pelvis to patient during admission but since she already has a PET scan scheduled for this Monday with her oncologist she wishes to proceed with this study instead   Anemia -Hemoglobin slightly lower than baseline of around 10-11 and is currently 9.4   Procedures:  ECHO; - Left ventricle: The cavity size was normal. Wall thickness was increased in a pattern of mild LVH. Systolic function was normal. The estimated ejection fraction was in the range of 60% to 65%. Wall motion was normal; there were  no regional wall motion abnormalities. Doppler parameters are consistent with abnormal left ventricular relaxation (grade 1 diastolic dysfunction).  Consultations:  none  Discharge  Exam: Filed Vitals:   01/09/16 2040 01/10/16 0558  BP: 143/55 144/63  Pulse: 80 82  Temp:  97.8 F (36.6 C)  Resp:  18    General: Alert in no distress Cardiovascular: S 1, S 2 RRR Respiratory: CTA  Discharge Instructions   Discharge Instructions    Diet - low sodium heart healthy    Complete by:  As directed      Increase activity slowly    Complete by:  As directed           Current Discharge Medication List    CONTINUE these medications which have NOT CHANGED   Details  aspirin 81 MG tablet Take 81 mg by mouth daily.    Cholecalciferol (VITAMIN D3) 5000 UNITS CAPS Take 1 capsule by mouth daily.    Cranberry 475 MG CAPS Take 1 capsule by mouth daily.    Multiple Vitamins-Minerals (CENTRUM SILVER ULTRA WOMENS PO) Take 1 tablet by mouth daily.   Associated Diagnoses: Breast cancer ([place])    Probiotic Product (PROBIOTIC DAILY PO) Take 1 capsule by mouth daily.    zolpidem (AMBIEN) 5 MG tablet Take 1 tablet by mouth at bedtime.     oxyCODONE (OXY IR/ROXICODONE) 5 MG immediate release tablet Take 1-2 tablets q 4hrs PRN pain. Qty: 90 tablet, Refills: 0   Associated Diagnoses: Cancer of central portion of breast ([place]); Breast cancer, right ([place])      STOP taking these medications     senna-docusate (SENOKOT-S) 8.6-50 MG per tablet        Allergies  Allergen Reactions  . Other Other (See Comments)    One of the "mycins" cannot remember which one or the reaction  . Penicillins Itching and Swelling    Has patient had a PCN reaction causing immediate rash, facial/tongue/throat swelling, SOB or lightheadedness with hypotension: Unknown Has patient had a PCN reaction causing severe rash involving mucus membranes or skin necrosis: No  Has patient had a PCN reaction that required hospitalization: Unknown Has patient had a PCN reaction occurring within the last 10 years: No  If all of the above answers are "NO", then may proceed with Cephalosporin use.   . Sulfa  Antibiotics Other (See Comments)    unknown  . Tape Other (See Comments)    Pt gets red and breaks out   Follow-up Information    Follow up with ARONSON,RICHARD A, MD In 1 week.   Specialty:  Internal Medicine   Contact information:   [address] [place] [address] [phone]        The results of significant diagnostics from this hospitalization (including imaging, microbiology, ancillary and laboratory) are listed below for reference.    Significant Diagnostic Studies: Dg Chest 2 View  01/09/2016  CLINICAL DATA:  Pt reports mid-sternal chest pain/pressure onset today; pt reports h/o bilateral breast cancer; non-smoker EXAM: CHEST  2 VIEW COMPARISON:  11/11/2015 FINDINGS: There is opacity at the right lung base collecting a small to moderate pleural effusion similar to the prior exam. There is associated right lung base atelectasis. Some hazy opacity is also projects adjacent to the minor fissure in the right upper lobe consistent with atelectasis. There is no convincing pulmonary edema or pneumonia. No left pleural effusion. No pneumothorax. Cardiac silhouette is top-normal in size. No mediastinal or hilar masses or convincing adenopathy. Left  anterior chest wall Port-A-Cath is stable with its tip at the caval atrial junction. There changes from bilateral breast surgery. Left shoulder prosthesis is well aligned. Bony thorax is demineralized. IMPRESSION: 1. Chronic small moderate right pleural effusion. There is also mild right upper and lower lobe atelectasis, which appears increased from the prior chest radiograph. 2. No evidence of pneumonia or pulmonary edema. Electronically Signed   By: Lajean Manes M.D.   On: 01/09/2016 14:13    Microbiology: No results found for this or any previous visit (from the past 240 hour(s)).   Labs: Basic Metabolic Panel:  Recent Labs Lab 01/09/16 1440  NA 139  K 4.1  CL 106  CO2 24  GLUCOSE 131*  BUN 11  CREATININE 0.58  CALCIUM  8.4*   Liver Function Tests:  Recent Labs Lab 01/09/16 1440  AST 256*  ALT 105*  ALKPHOS 227*  BILITOT 0.8  PROT 5.4*  ALBUMIN 2.8*   No results for input(s): LIPASE, AMYLASE in the last 168 hours. No results for input(s): AMMONIA in the last 168 hours. CBC:  Recent Labs Lab 01/09/16 1440  WBC 8.0  HGB 9.4*  HCT 30.5*  MCV 78.0  PLT 280   Cardiac Enzymes:  Recent Labs Lab 01/09/16 2135 01/10/16 0400 01/10/16 0838  TROPONINI <0.03 <0.03 <0.03   BNP: BNP (last 3 results) No results for input(s): BNP in the last 8760 hours.  ProBNP (last 3 results) No results for input(s): PROBNP in the last 8760 hours.  CBG: No results for input(s): GLUCAP in the last 168 hours.     Signed:  Elmarie Shiley MD.  [facility] 01/10/2016, 2:05 PM

## 2016-01-11 ENCOUNTER — Ambulatory Visit (HOSPITAL_COMMUNITY)
Admission: RE | Admit: 2016-01-11 | Discharge: 2016-01-11 | Disposition: A | Discharge status: Home / Self Care | Payer: Medicare Other | Source: Ambulatory Visit | Attending: Oncology | Admitting: Oncology

## 2016-01-11 DIAGNOSIS — C50911 Malignant neoplasm of unspecified site of right female breast: Secondary | ICD-10-CM | POA: Insufficient documentation

## 2016-01-11 DIAGNOSIS — D509 Iron deficiency anemia, unspecified: Secondary | ICD-10-CM | POA: Insufficient documentation

## 2016-01-11 DIAGNOSIS — R928 Other abnormal and inconclusive findings on diagnostic imaging of breast: Secondary | ICD-10-CM | POA: Insufficient documentation

## 2016-01-11 DIAGNOSIS — C50919 Malignant neoplasm of unspecified site of unspecified female breast: Secondary | ICD-10-CM

## 2016-01-11 DIAGNOSIS — J90 Pleural effusion, not elsewhere classified: Secondary | ICD-10-CM | POA: Insufficient documentation

## 2016-01-11 DIAGNOSIS — I700 Atherosclerosis of aorta: Secondary | ICD-10-CM | POA: Diagnosis not present

## 2016-01-11 DIAGNOSIS — K639 Disease of intestine, unspecified: Secondary | ICD-10-CM | POA: Insufficient documentation

## 2016-01-11 DIAGNOSIS — Z96612 Presence of left artificial shoulder joint: Secondary | ICD-10-CM | POA: Diagnosis not present

## 2016-01-11 DIAGNOSIS — Z9011 Acquired absence of right breast and nipple: Secondary | ICD-10-CM | POA: Diagnosis not present

## 2016-01-11 LAB — GLUCOSE, CAPILLARY: Glucose-Capillary: 78 mg/dL (ref 65–99)

## 2016-01-11 MED ORDER — FLUDEOXYGLUCOSE F - 18 (FDG) INJECTION
5.5600 | Freq: Once | INTRAVENOUS | Status: AC | PRN
  Administered 2016-01-11: 5.56 via INTRAVENOUS

## 2016-02-16 IMAGING — CR DG RIBS W/ CHEST 3+V*R*
3 series · 3 of 3 positions shown · non-contrast
Comparison: PET scan 07/21/2015.

CLINICAL DATA: Fall today. Landed on back. Swelling bruising. Rib
injury.

EXAM:
RIGHT RIBS AND CHEST - 3+ VIEW

[x chest ap]
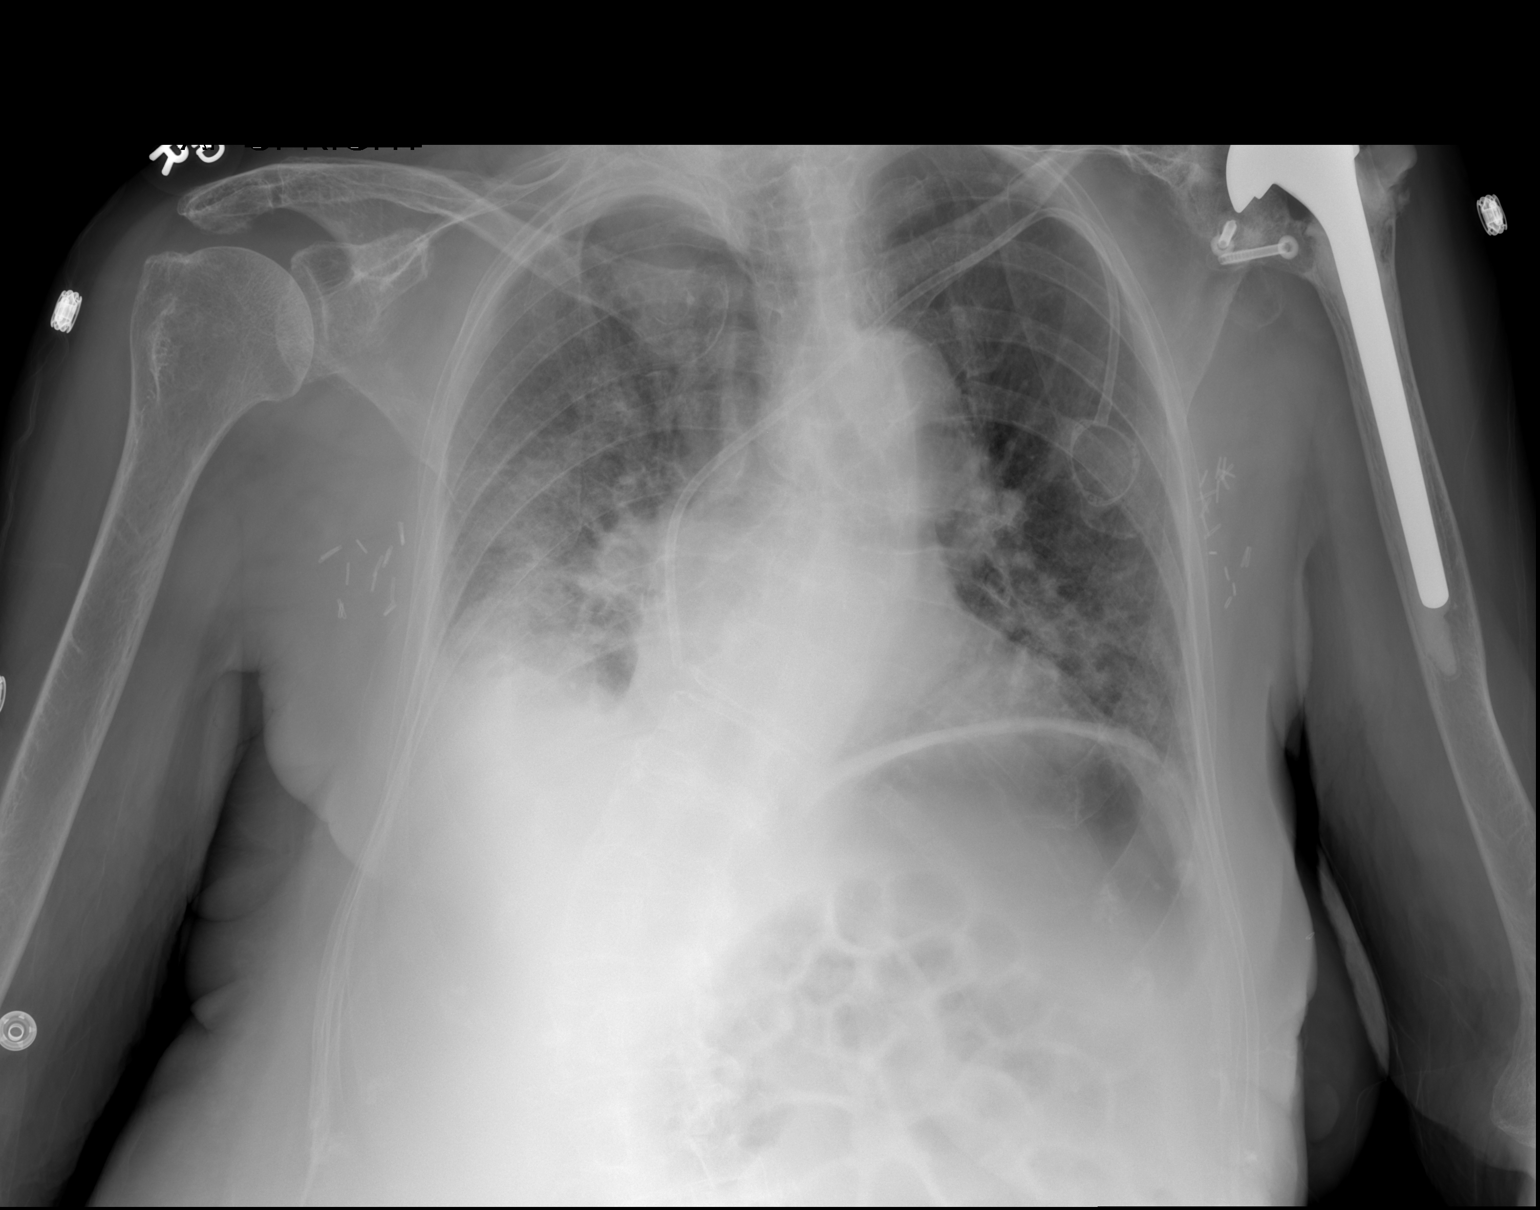

[t ribs rpo right (1 of 2)]
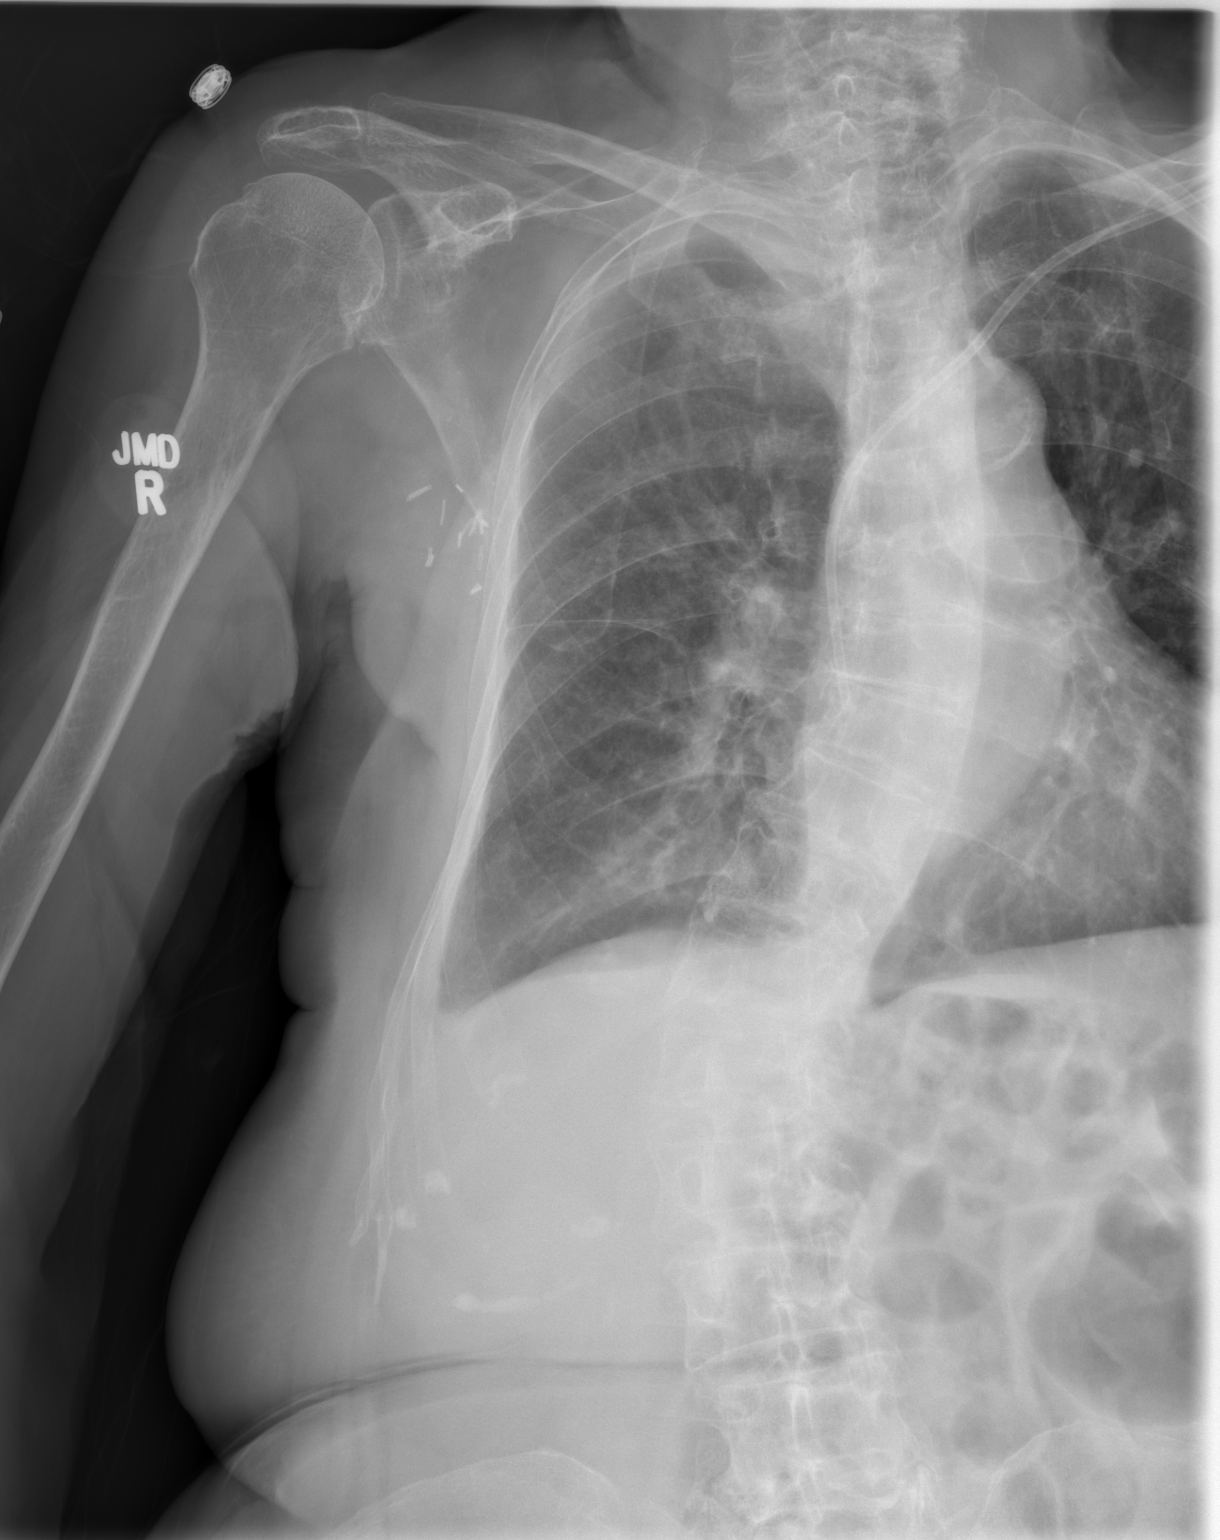

[t ribs rpo right (2 of 2)]
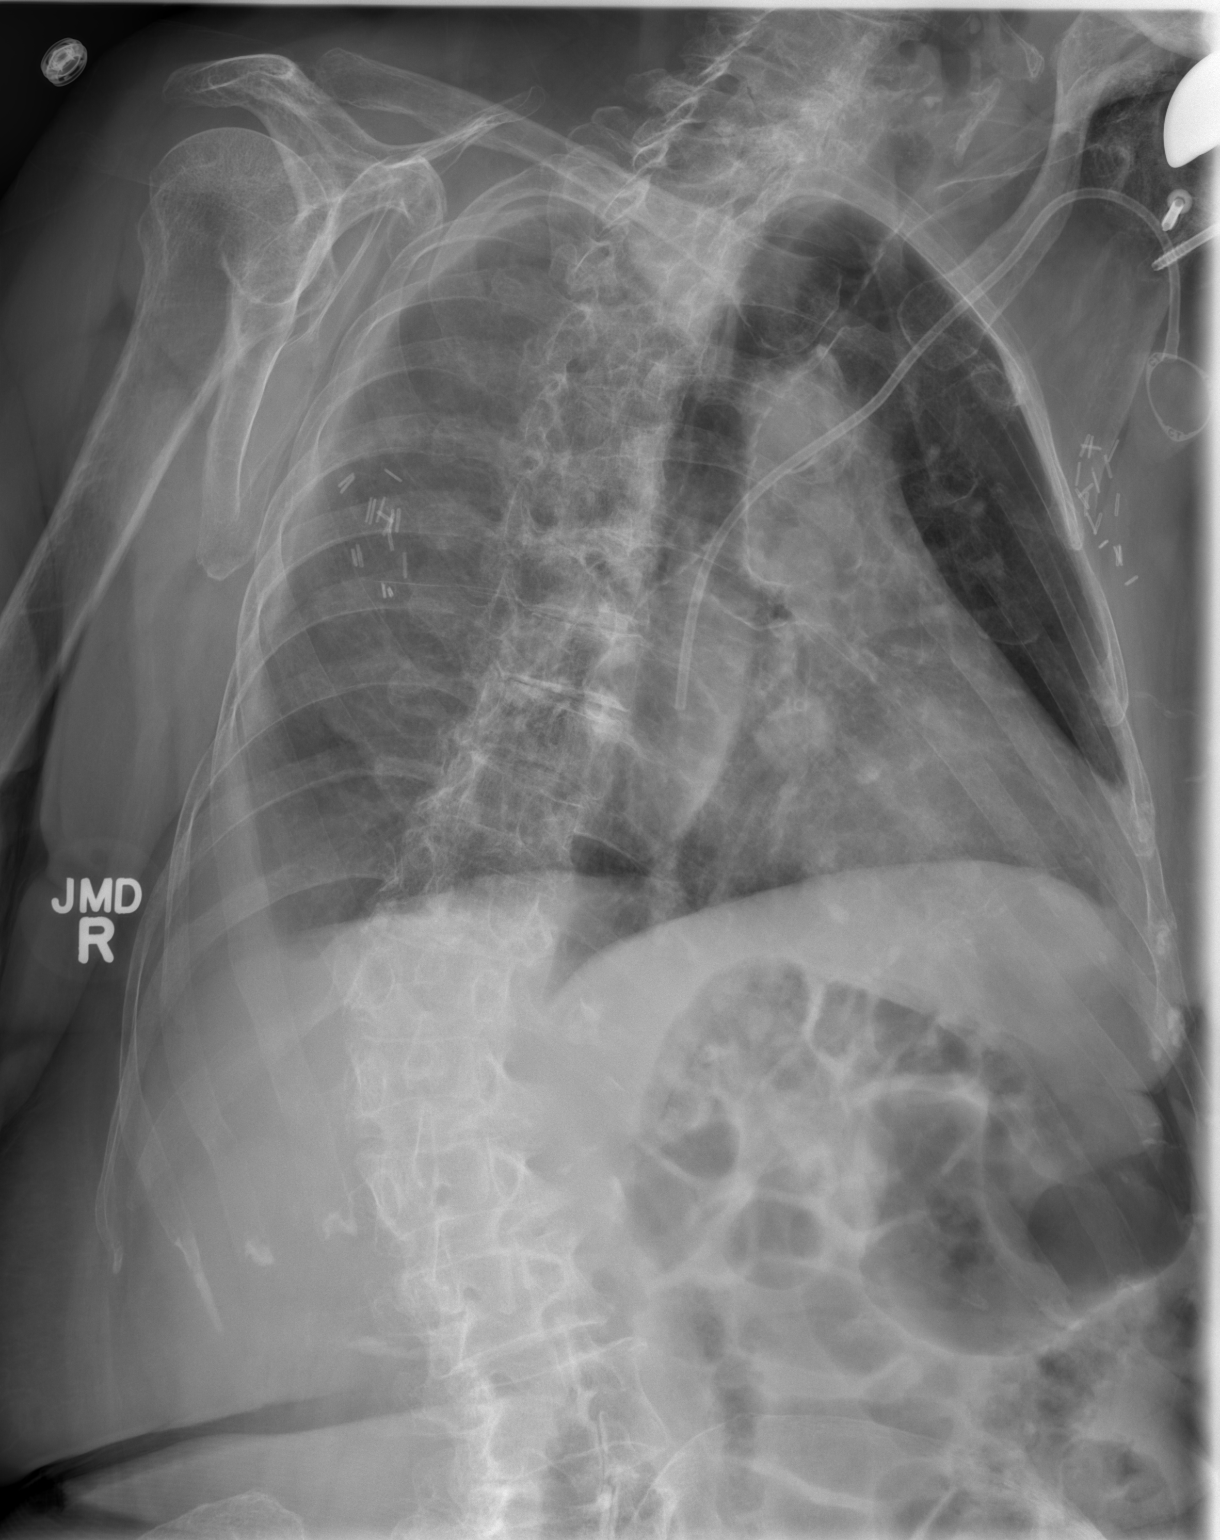

[3 of 3 positions shown; findings below may reference images not displayed]

FINDINGS: The heart is mildly enlarged. A left subclavian Port-A-Cath is in
place. Bilateral axillary node dissections are present. A left
shoulder surgery is present.

Right basilar airspace disease and effusion are again noted. This is
somewhat increased from a prior exam. The left base is clear.

Dedicated images of the right ribs demonstrate no acute or healing
fracture. Scoliosis of thoracolumbar spine is noted.
IMPRESSION: 1. Small right pleural effusion and associated airspace disease
slightly increased from the prior exam. While this likely represents
atelectasis, early infection is not excluded. Pulmonary contusion is
less likely.
2. No acute or healing rib fractures.

## 2016-02-16 IMAGING — CT CT CHEST W/ CM
5 of 13 series · 12 of 36 positions shown, 13 images · IV contrast (omnipaque)
Comparison: Chest x-ray from the same day.  PET scan 07/21/2015.

CLINICAL DATA: Fall this morning. It coffee table with back. Low
back pain. Personal history breast cancer. Abnormal PET scan
suggesting possibility of colon cancer.

EXAM:
CT CHEST, ABDOMEN, AND PELVIS WITH CONTRAST
TECHNIQUE: Multidetector CT imaging of the chest, abdomen and pelvis was
performed following the standard protocol during bolus
administration of intravenous contrast.
CONTRAST:  80mL OMNIPAQUE IOHEXOL 300 MG/ML  SOLN

[Series 9: thoracic/lumbar st · axial · 0.35mm/px · z∈[-412,-91]mm · 4 of 179 slices shown, 5 images]
[im 36/179  mediastinal]
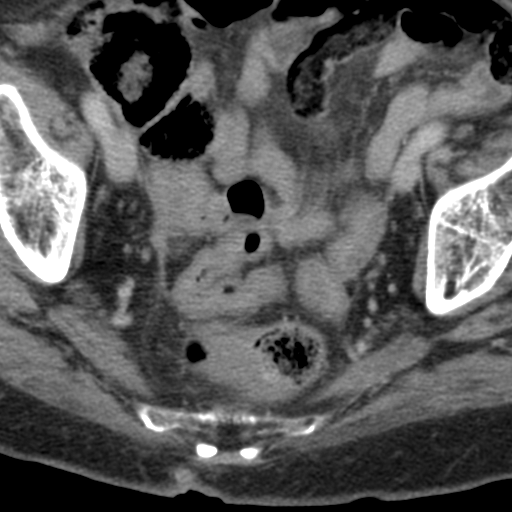
[im 36/179  lung]
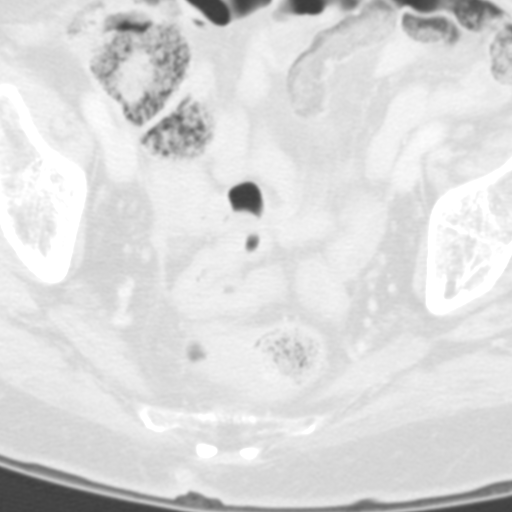
[im 72/179  lung]
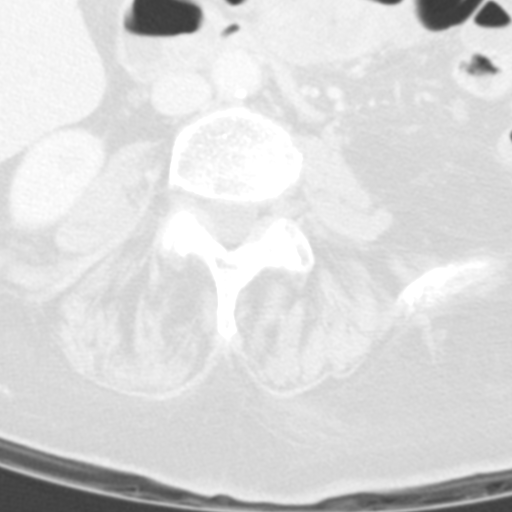
[im 107/179  lung]
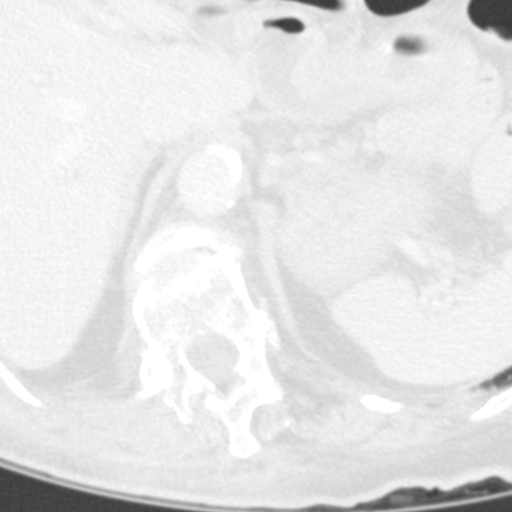
[im 143/179  lung]
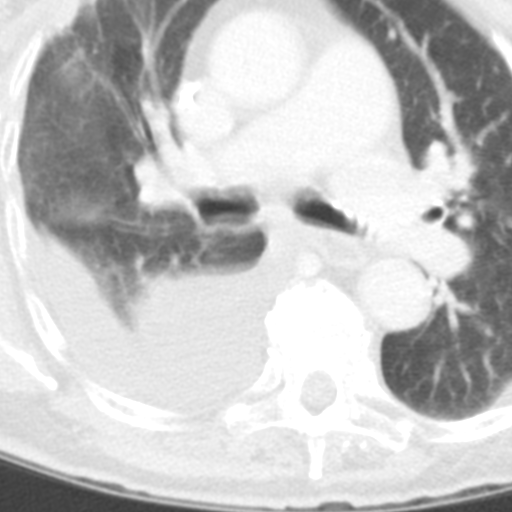

[Series 16: thoracic/lumbar bone · axial · 0.35mm/px · z∈[-244,-115]mm · 2 of 131 slices shown]
[im 44/131  lung]
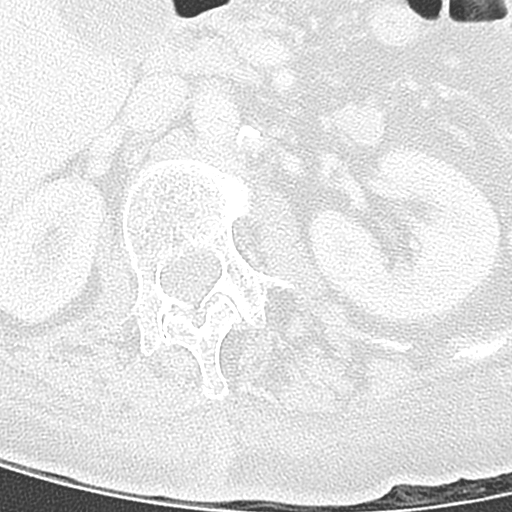
[im 87/131  lung]
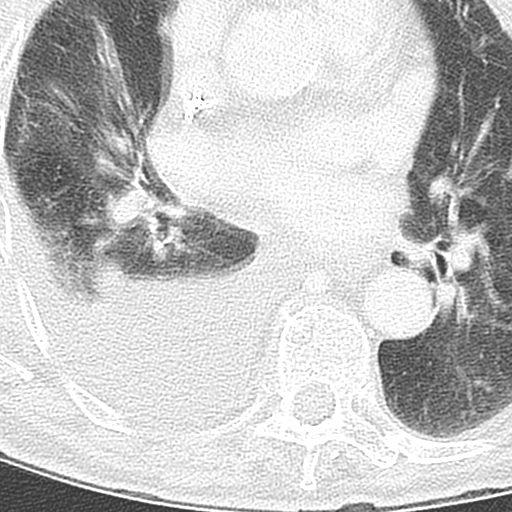

[Series 605: <mpr thick range(3)> · axial · 0.59mm/px · z∈[-197,-61]mm · 3 of 138 slices shown]
[im 35/138  lung]
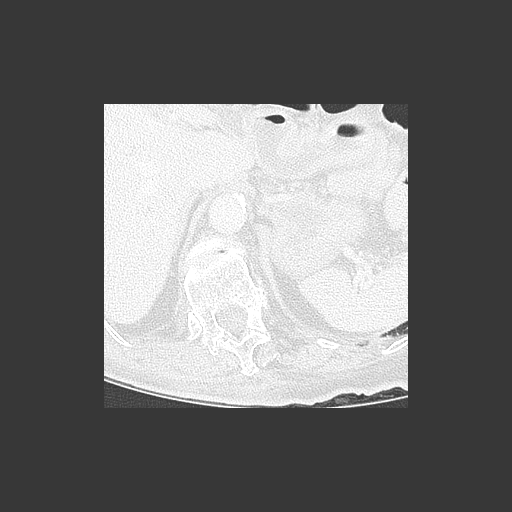
[im 69/138  lung]
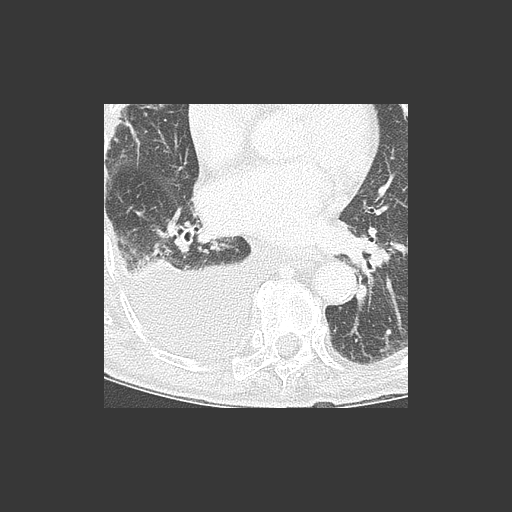
[im 103/138  lung]
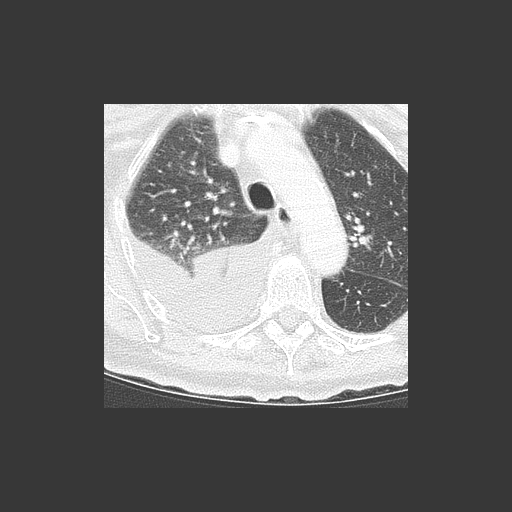

[Series 606: <mpr thick range(4)> · coronal · 0.59mm/px · 1 of 95 slices shown]
[im 48/95  lung]
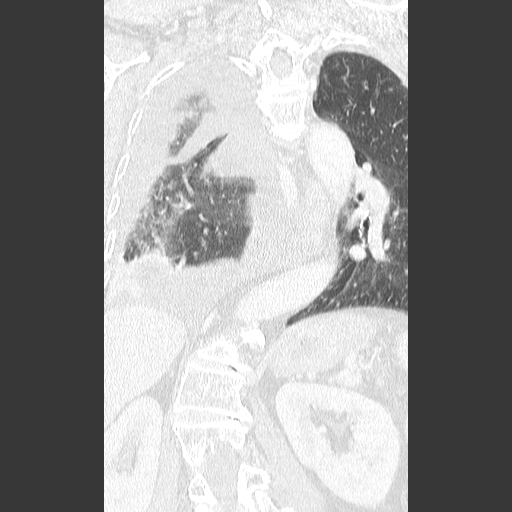

[Series 609: <mpr thick range(7)> · axial · 0.59mm/px · z∈[-169,-83]mm · 2 of 130 slices shown]
[im 44/130  lung]
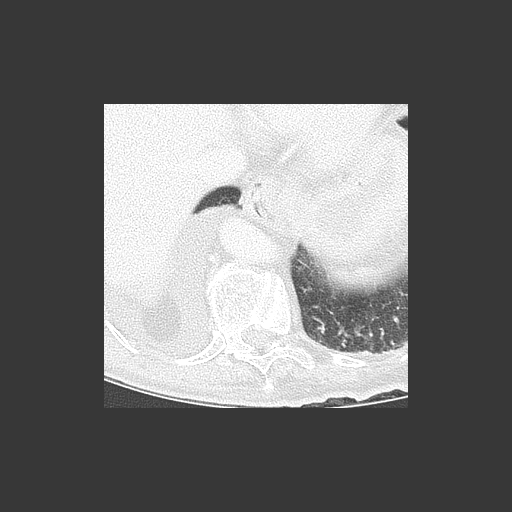
[im 87/130  lung]
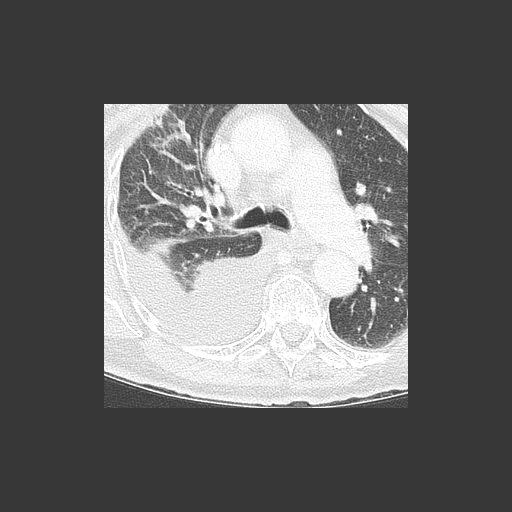

[12 of 36 positions shown; findings below may reference images not displayed]

FINDINGS: CT CHEST FINDINGS

Mediastinum/Lymph Nodes: Mitral valve annular calcifications are
present. A left subclavian Port-A-Cath is in place. Heart size is
normal. No significant pericardial effusion is present.

Lungs/Pleura: A moderate-sized right pleural effusion is present.
There is some associated atelectasis. Mild peripheral reticular
disease is noted in the air right middle lobe as well. There is
minimal dependent atelectasis on the left. No other focal nodule,
mass, or airspace disease is present.

Musculoskeletal: Dedicated reformats of the thoracic spine
demonstrate levoconvex scoliosis. Vertebral body heights and
alignment are maintained. There is no acute fracture.

CT ABDOMEN PELVIS FINDINGS

Hepatobiliary: Mild intrahepatic biliary dilation is present. The
gallbladder is mildly distended with layering sludge. The common
bile duct is unremarkable. No mass lesion is present.

Pancreas: Within normal limits

Spleen: Normal

Adrenals/Urinary Tract: Adrenal glands are within normal limits
bilaterally. Subcentimeter cysts are present in both kidneys. The
punctate nonobstructing stone is noted at the lower pole of the left
kidney. The ureters are within normal limits bilaterally. The
urinary bladder is unremarkable.

Stomach/Bowel: A small hiatal hernia is present. Stomach and
duodenum are otherwise within normal limits. Small bowel is
unremarkable.

Moderate stool is present throughout the colon.

Vascular/Lymphatic: No significant adenopathy is present. There are
small mesenteric lymph nodes. Atherosclerotic calcifications are
present in the aorta without aneurysm.

Reproductive: Within normal limits

Other:

Musculoskeletal: Dedicated reformatted images of the a lumbar spine
reveal nondisplaced right transverse process fractures at L1 and L2.
Scoliosis is convex to the right at L2. Vertebral body heights and
alignment are maintained. Multilevel facet degenerative changes are
present.

There is a soft tissue contusion within the subcutaneous tissues
posterior and to the right of L1 and L2.
IMPRESSION: 1. Soft tissue contusion and nondisplaced right transverse fractures
at L1 and L2.
2. No other acute fractures or soft tissue trauma.
3. Scoliosis.
4. Moderate right pleural effusion and associated atelectasis.
Punctate nonobstructing stone at the lower pole
5. Punctate nonobstructing stone at the lower pole of the left
kidney.
6. Small hiatal hernia.

## 2016-03-24 ENCOUNTER — Other Ambulatory Visit: Payer: Self-pay | Admitting: Oncology

## 2016-03-24 ENCOUNTER — Ambulatory Visit
Admission: RE | Admit: 2016-03-24 | Discharge: 2016-03-24 | Disposition: A | Discharge status: Home / Self Care | Payer: Medicare Other | Source: Ambulatory Visit | Attending: Oncology | Admitting: Oncology

## 2016-03-24 DIAGNOSIS — R52 Pain, unspecified: Secondary | ICD-10-CM

## 2016-05-26 IMAGING — DX DG CHEST 2V
2 series · 2 of 2 positions shown · non-contrast
Comparison: 11/11/2015

CLINICAL DATA: Pt reports mid-sternal chest pain/pressure onset
today; pt reports h/o bilateral breast cancer; non-smoker

EXAM:
CHEST  2 VIEW

[x chest ap]
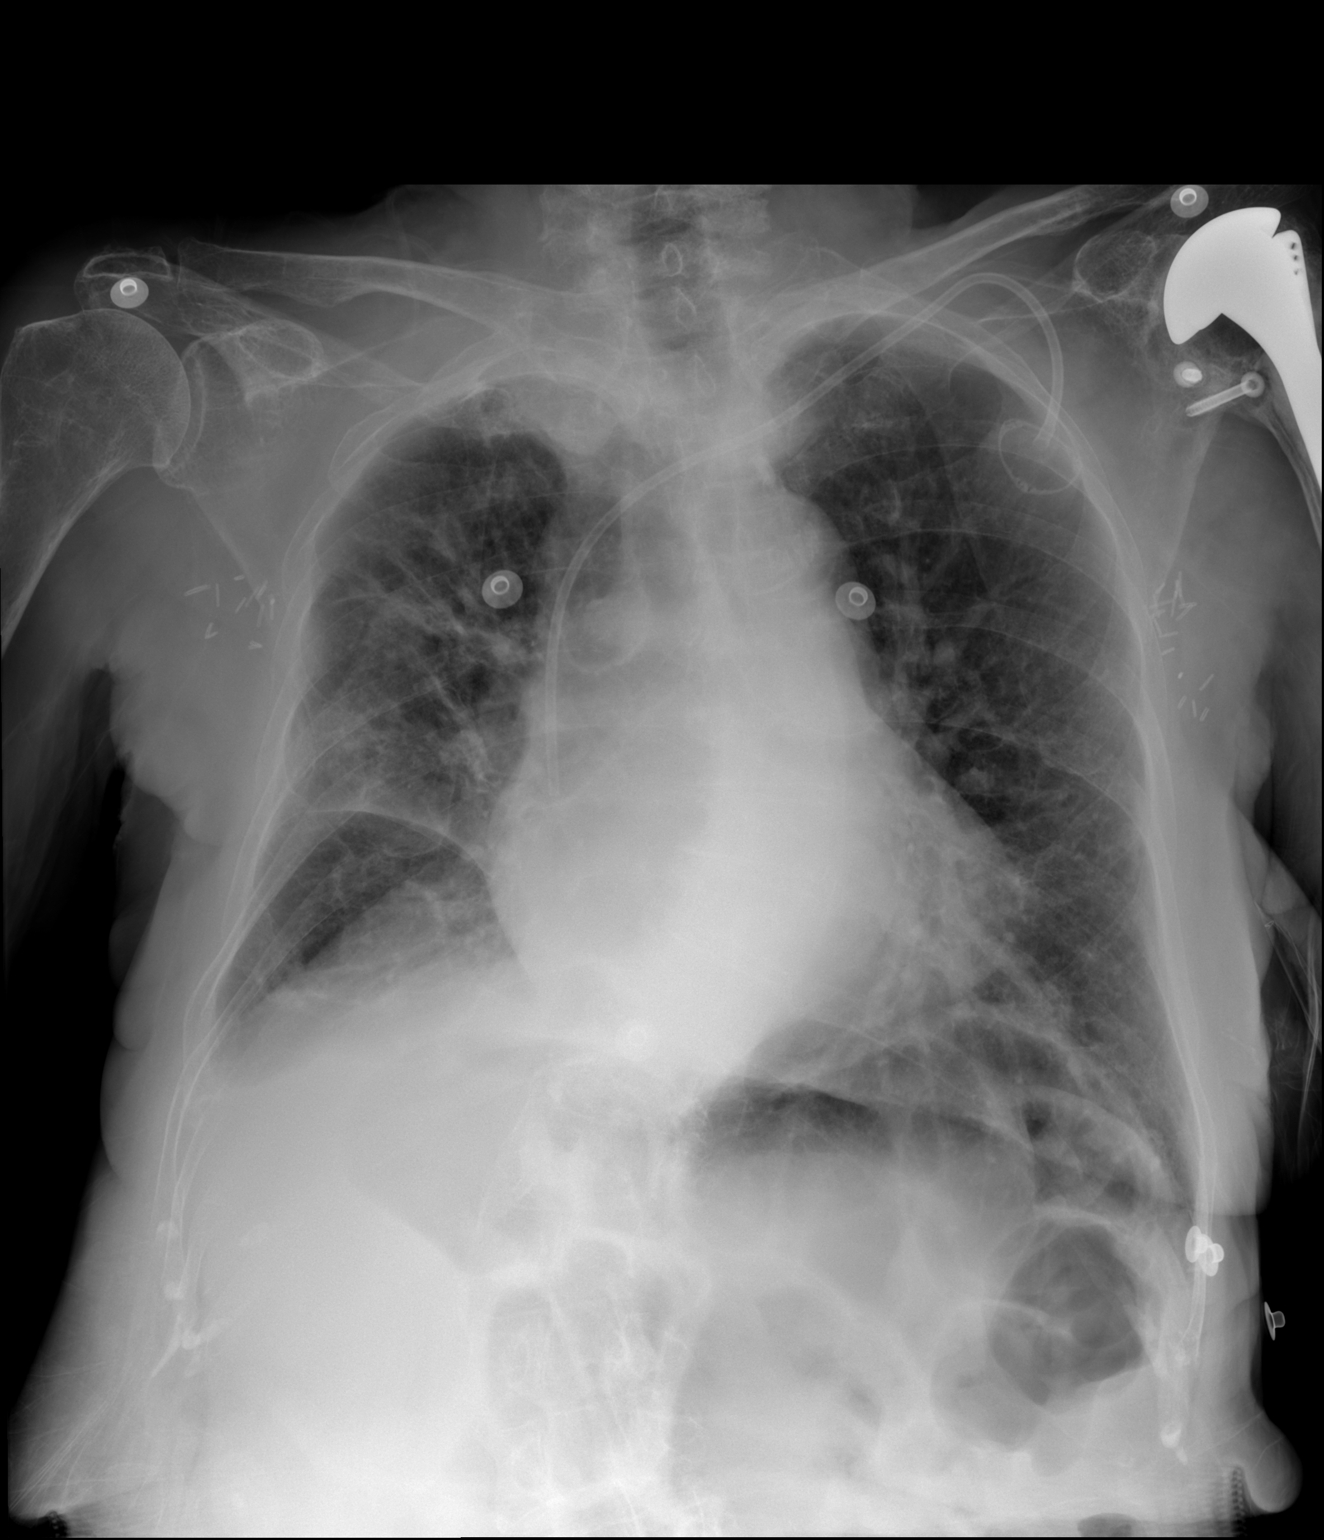

[w chest lat]
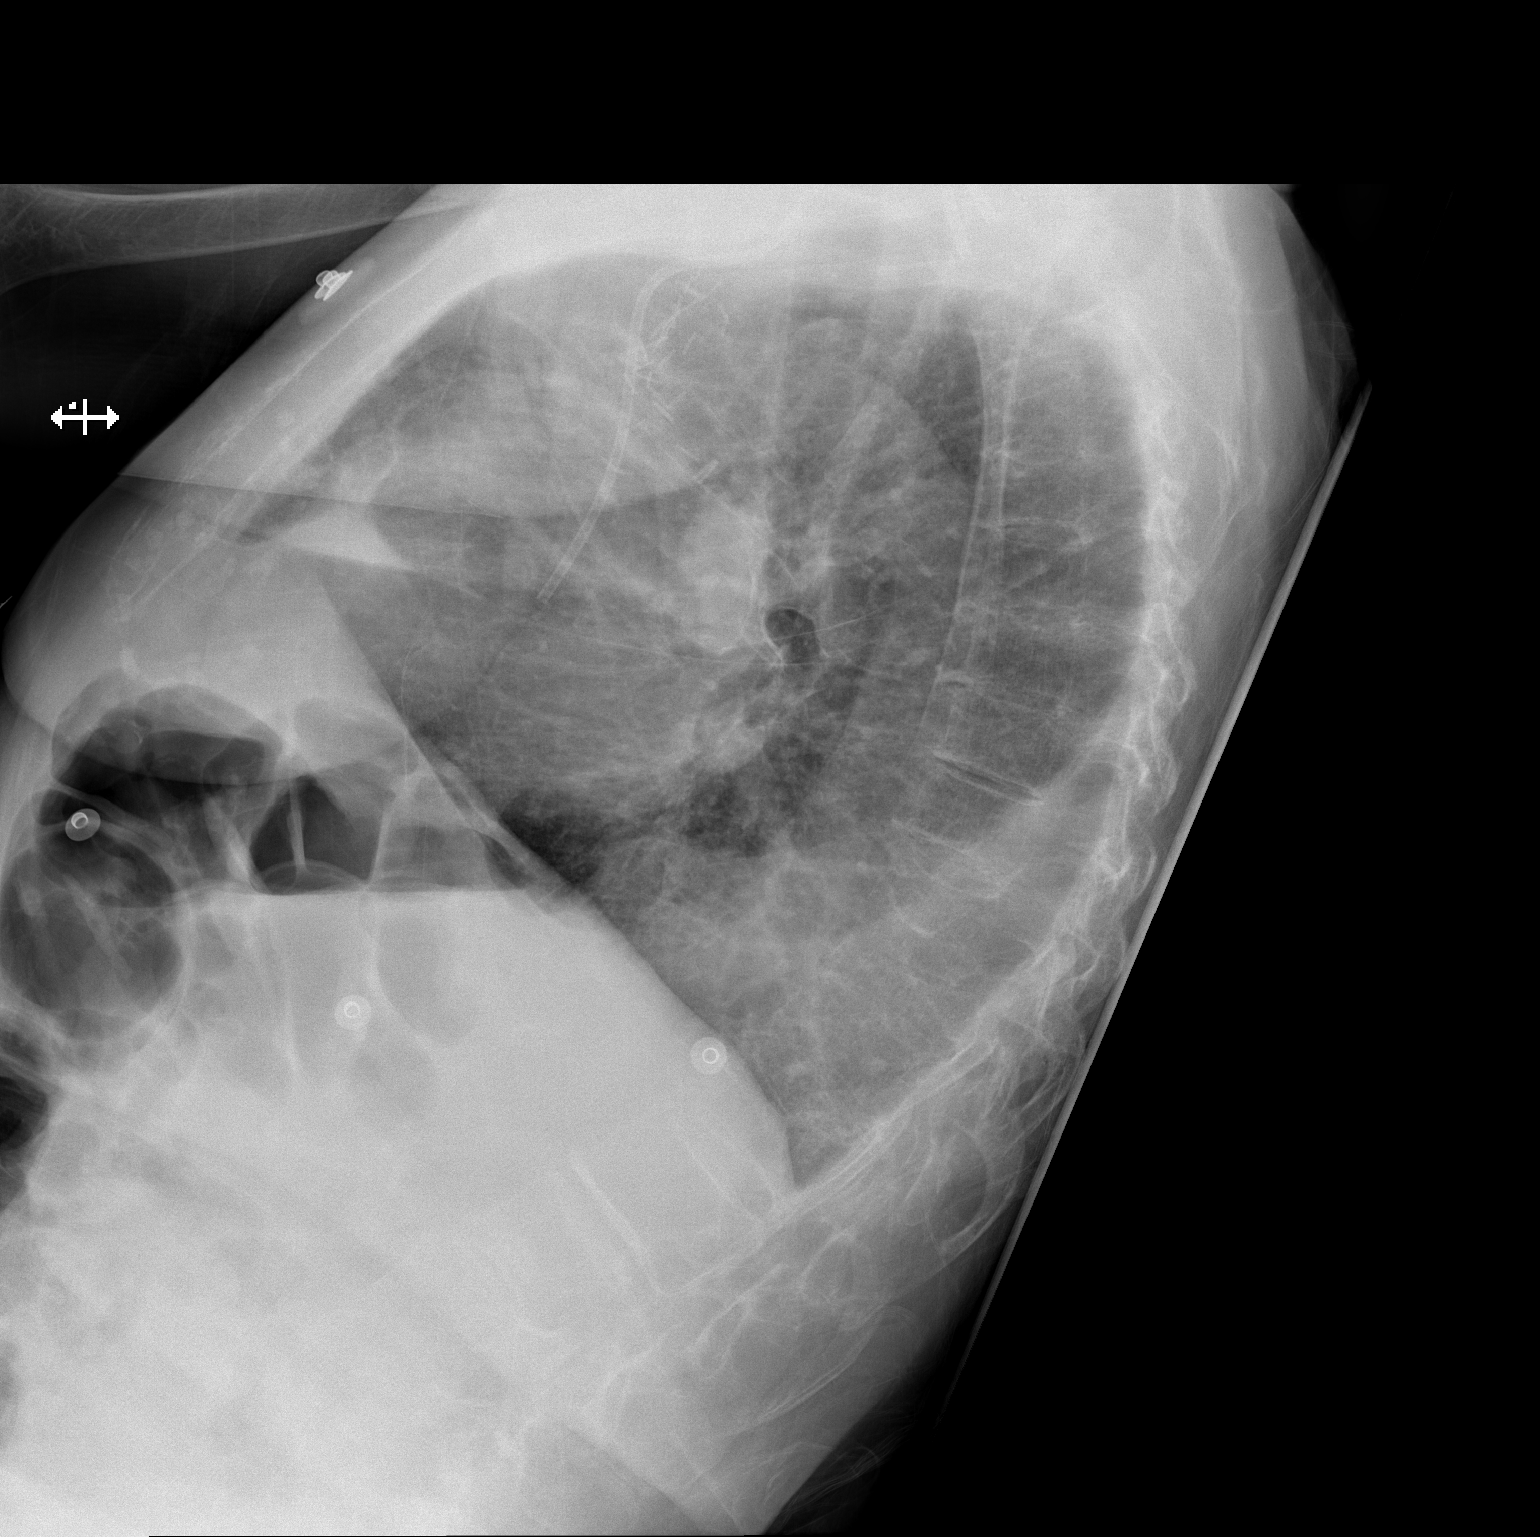

[2 of 2 positions shown; findings below may reference images not displayed]

FINDINGS: There is opacity at the right lung base collecting a small to
moderate pleural effusion similar to the prior exam. There is
associated right lung base atelectasis. Some hazy opacity is also
projects adjacent to the minor fissure in the right upper lobe
consistent with atelectasis. There is no convincing pulmonary edema
or pneumonia. No left pleural effusion. No pneumothorax.

Cardiac silhouette is top-normal in size. No mediastinal or hilar
masses or convincing adenopathy.

Left anterior chest wall Port-A-Cath is stable with its tip at the
caval atrial junction.

There changes from bilateral breast surgery.

Left shoulder prosthesis is well aligned. Bony thorax is
demineralized.
IMPRESSION: 1. Chronic small moderate right pleural effusion. There is also mild
right upper and lower lobe atelectasis, which appears increased from
the prior chest radiograph.
2. No evidence of pneumonia or pulmonary edema.

## 2016-10-21 DEATH — deceased

## 2017-04-05 ENCOUNTER — Encounter: Payer: Self-pay | Admitting: Gynecology
